# Patient Record
Sex: Female | Born: 1965 | Race: White | Marital: Married | Smoking: Never smoker
Health system: Northeastern US, Academic
[De-identification: ages and names within clinical notes are randomized; demographics above are authoritative.]

## PROBLEM LIST (undated history)

## (undated) DIAGNOSIS — I10 Essential (primary) hypertension: Secondary | ICD-10-CM

## (undated) DIAGNOSIS — E119 Type 2 diabetes mellitus without complications: Secondary | ICD-10-CM

## (undated) DIAGNOSIS — J45909 Unspecified asthma, uncomplicated: Secondary | ICD-10-CM

## (undated) DIAGNOSIS — K219 Gastro-esophageal reflux disease without esophagitis: Secondary | ICD-10-CM

## (undated) DIAGNOSIS — F419 Anxiety disorder, unspecified: Secondary | ICD-10-CM

## (undated) DIAGNOSIS — C50911 Malignant neoplasm of unspecified site of right female breast: Secondary | ICD-10-CM

## (undated) DIAGNOSIS — F32A Depression, unspecified: Secondary | ICD-10-CM

## (undated) HISTORY — PX: CHOLECYSTECTOMY: SHX55

## (undated) HISTORY — PX: BILATERAL MASTECTOMY: SHX3A

## (undated) HISTORY — PX: OTHER SURGICAL HISTORY: SHX169

## (undated) HISTORY — PX: APPENDECTOMY: SHX54

---

## 2007-03-31 ENCOUNTER — Encounter: Payer: Self-pay | Admitting: Cardiology

## 2007-07-11 ENCOUNTER — Ambulatory Visit (HOSPITAL_COMMUNITY): Admission: RE | Admit: 2007-07-11 | Discharge: 2007-07-11 | Payer: Self-pay | Admitting: Obstetrics and Gynecology

## 2009-01-29 IMAGING — MG MS DIGITAL SCREENING BILAT
4 series · 4 of 4 positions shown · non-contrast
Comparison: none

DG SCHOLARSHIP MAMMOGRAM
Bilateral CC and MLO view(s) were taken.

DIGITAL SCREENING MAMMOGRAM WITH CAD:
The breast tissue is heterogeneously dense.  There is no dominant mass, architectural distortion or
calcification to suggest malignancy.

[R CC]
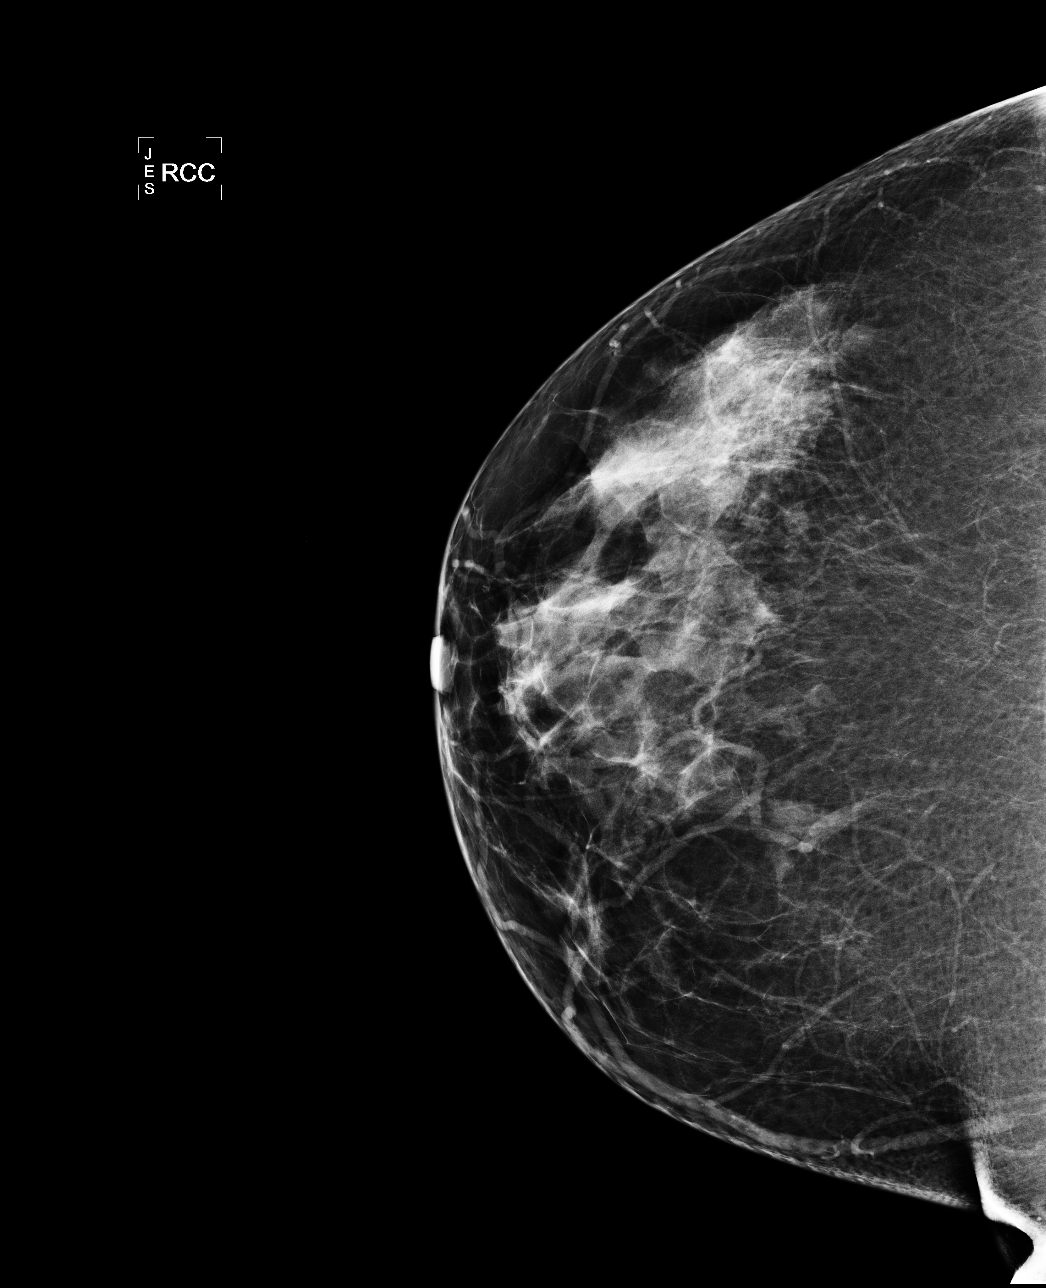

[R MLO]
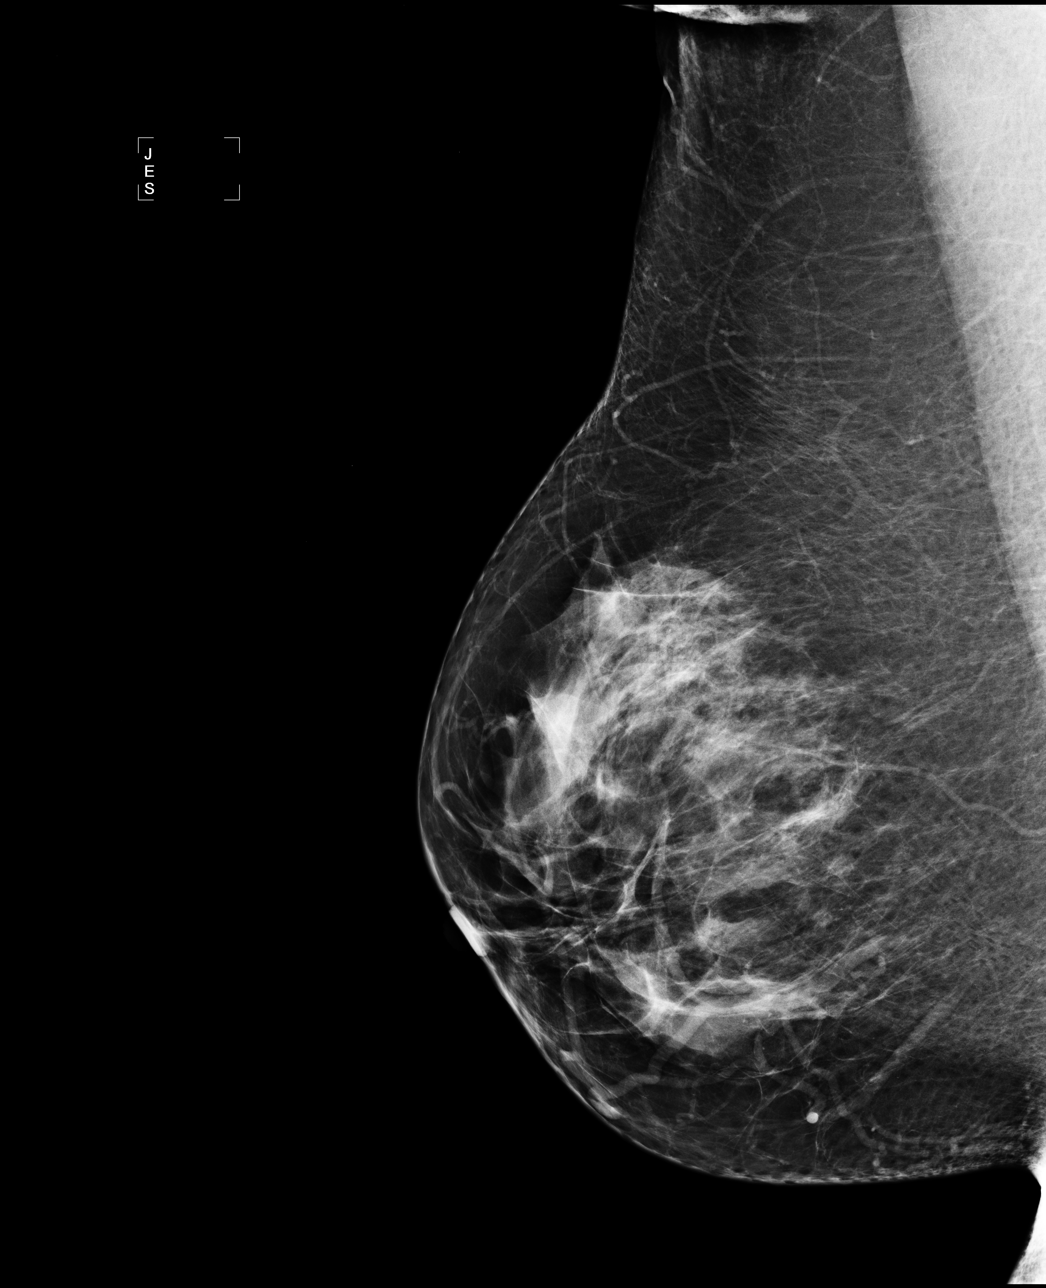

[L CC]
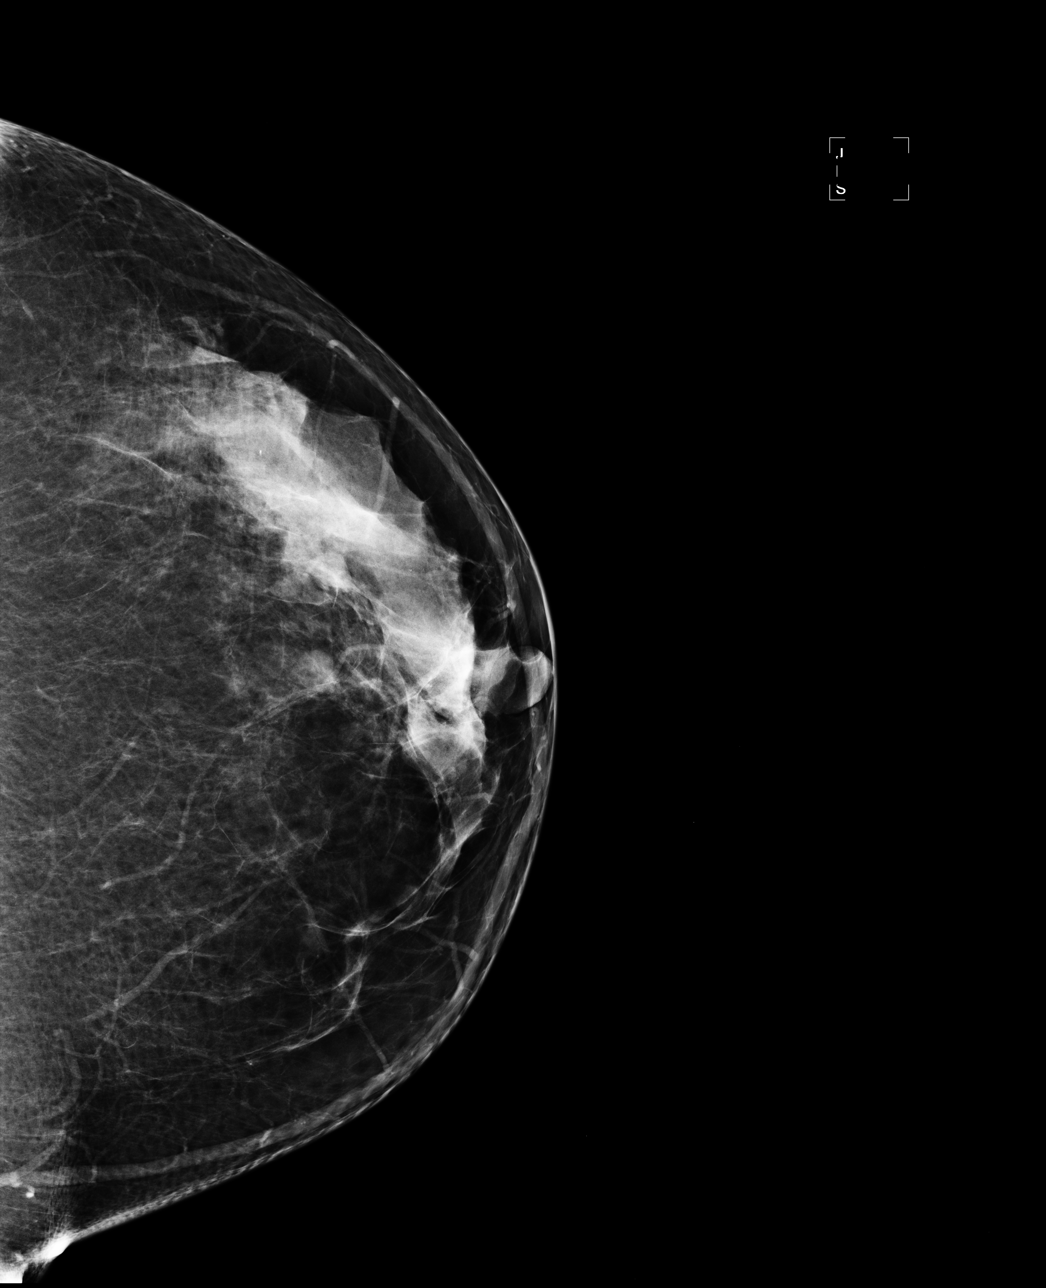

[L MLO]
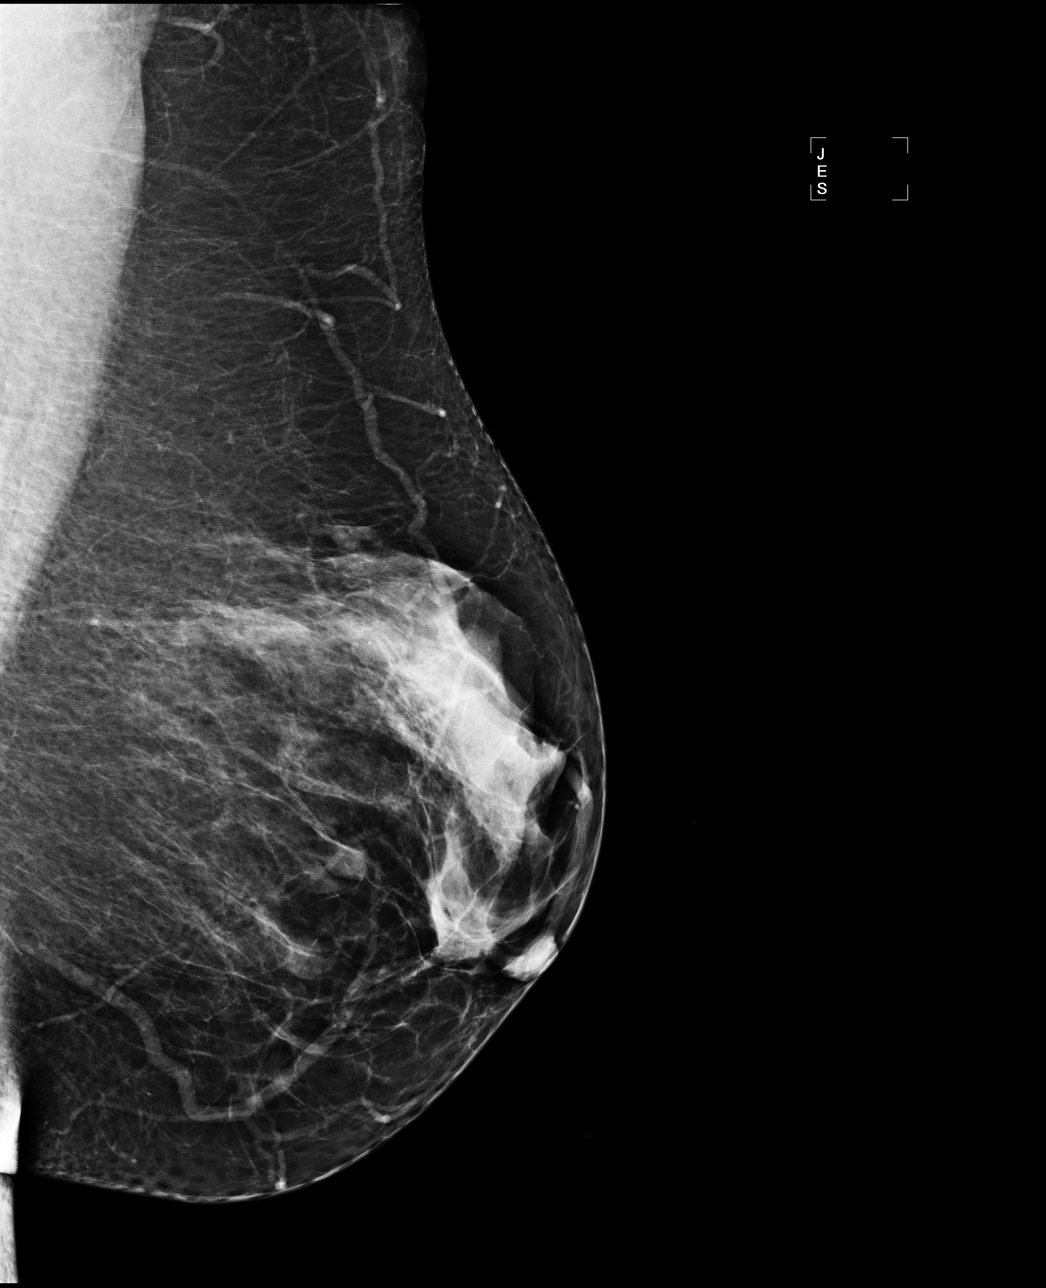

[4 of 4 positions shown; findings below may reference images not displayed]

IMPRESSION: No mammographic evidence of malignancy.  Suggest yearly screening mammography.

ASSESSMENT: Negative - BI-RADS 1

Screening mammogram in 1 year.
ANALYZED BY COMPUTER AIDED DETECTION. , THIS PROCEDURE WAS A DIGITAL MAMMOGRAM.

## 2010-12-03 ENCOUNTER — Encounter: Payer: Self-pay | Admitting: *Deleted

## 2010-12-07 NOTE — Telephone Encounter (Signed)
A user error has taken place: encounter opened in error, closed for administrative reasons.

## 2012-03-29 ENCOUNTER — Other Ambulatory Visit: Payer: Self-pay | Admitting: Family Medicine

## 2012-11-21 ENCOUNTER — Encounter: Payer: Self-pay | Admitting: Neurosurgery

## 2012-11-21 ENCOUNTER — Ambulatory Visit
Admit: 2012-11-21 | Discharge: 2012-11-21 | Disposition: A | Discharge status: Home / Self Care | Payer: Self-pay | Source: Ambulatory Visit | Attending: Neurosurgery | Admitting: Neurosurgery

## 2012-11-21 DIAGNOSIS — M51379 Other intervertebral disc degeneration, lumbosacral region without mention of lumbar back pain or lower extremity pain: Secondary | ICD-10-CM

## 2012-11-21 DIAGNOSIS — M5137 Other intervertebral disc degeneration, lumbosacral region: Secondary | ICD-10-CM | POA: Diagnosis present

## 2012-11-21 HISTORY — DX: Other intervertebral disc degeneration, lumbosacral region: M51.37

## 2012-11-21 HISTORY — DX: Depression, unspecified: F32.A

## 2012-11-21 HISTORY — DX: Unspecified asthma, uncomplicated: J45.909

## 2012-11-21 HISTORY — DX: Essential (primary) hypertension: I10

## 2012-11-21 HISTORY — DX: Gastro-esophageal reflux disease without esophagitis: K21.9

## 2012-11-21 HISTORY — DX: Other intervertebral disc degeneration, lumbosacral region without mention of lumbar back pain or lower extremity pain: M51.379

## 2012-11-21 HISTORY — DX: Anxiety disorder, unspecified: F41.9

## 2012-11-21 LAB — CBC
Hematocrit: 37 % (ref 34–45)
Hemoglobin: 12.5 g/dL (ref 11.2–15.7)
MCV: 86 fL (ref 79–95)
Platelets: 319 10*3/uL (ref 160–370)
RBC: 4.3 MIL/uL (ref 3.9–5.2)
RDW: 13 % (ref 11.7–14.4)
WBC: 7.9 10*3/uL (ref 4.0–10.0)

## 2012-11-21 LAB — PROTIME-INR
INR: 1.1 (ref 1.0–1.2)
Protime: 11.6 s (ref 9.2–12.3)

## 2012-11-21 LAB — TYPE AND SCREEN
ABO RH Blood Type: A POS
Antibody Screen: NEGATIVE

## 2012-11-21 LAB — APTT: aPTT: 31.2 s (ref 25.8–37.9)

## 2012-11-21 LAB — BASIC METABOLIC PANEL
Anion Gap: 13 (ref 7–16)
CO2: 24 mmol/L (ref 20–28)
Calcium: 9.3 mg/dL (ref 8.6–10.2)
Chloride: 101 mmol/L (ref 96–108)
Creatinine: 0.74 mg/dL (ref 0.51–0.95)
GFR,Black: 112 *
GFR,Caucasian: 97 *
Glucose: 110 mg/dL — ABNORMAL HIGH (ref 60–99)
Lab: 10 mg/dL (ref 6–20)
Potassium: 4 mmol/L (ref 3.3–5.1)
Sodium: 138 mmol/L (ref 133–145)

## 2012-11-21 NOTE — Discharge Instructions (Signed)
Pre-Operative Instructions Record                    [facility] [id number] MR    PRIOR TO SURGERY    Starting now, you must STOP all aspirin, ibuprofen (Advil, Naproxen, Aleve, Motrin, etc.) and all vitamins and herbal supplements. YOU MAY TAKE ACETAMINOPHEN (TYLENOL).    FOLLOW YOUR SURGEON'S INSTRUCTIONS IF DIFFERENT THAN ABOVE.    FRIDAY BEFORE SURGERY - 7/11    Call [phone] between 1PM and 4PM and select option #1 to receive your arrival/surgery time.            Do not eat anything (including candy or gum) after midnight the night before your surgery.  ____________________________________________________________________________    DAY OF SURGERY - 7/14    Up to 4 hours before your surgery time, clear liquids are allowed (unless your doctor tells you differently).  Examples: black coffee or tea (no dairy or non-dairy creamer), soda, water, clear apple or cranberry juice.  No orange or tomato juice.     MEDICATIONS   PLEASE REFER TO THE MEDICATION LIST ON THE ATTACHED PAGE AND ONLY TAKE THE MEDICATIONS MARKED ON THAT LIST.   DO NOT TAKE ANY MEDICATIONS THAT WERE ALREADY STOPPED (ABOVE).   Pain and anxiety medications may be taken with a sip of water at any time.    DO NOT WEAR ANY RINGS, JEWELRY, MAKEUP, DARK NAIL POLISH, HAIR PINS, BODY LOTION OR SCENTS.  You may brush your teeth and use deodorant.  If wearing eyeglasses, please bring a case.  DO NOT WEAR CONTACT LENSES.          _X_ Skin prep and instructions given      _X_ Bring back brace  ________________________________________________________________________________    AT THE HOSPITAL  Park in the Main Ramp garage.  Report to [facility] on [address].  Leave your belongings in the car and your visitors can bring them to your room after surgery.    Any questions? Call [phone], select option 1, and ask to speak with a nurse or call your  surgeon.    The patient has participated in the development of this discharge plan and the above material has been reviewed.  Questions have been answered and he/she understands the contents of this plan and has received a copy of it.  Randie Heinz, RN 11/21/2012 11:01 AM        ON THE DAY OF YOUR SURGERY, FROM THE LIST OF MEDICATIONS BELOW, TAKE ONLY THOSE MEDICATIONS CHECKED.

## 2012-11-21 NOTE — Preop H&P (Signed)
OUTPATIENT  Chief Complaint: Lumbar degeneration    History of Present Illness:  HPI  47 year old female presents with complaints of lumbar degeneration seeking a TLIF L5-S1 with lumbar laminectomies.  Patient denies any known or personal family history of complications related to anesthesia.      Past Medical History   Diagnosis Date   . Degeneration of lumbar or lumbosacral intervertebral disc 11/21/2012   . Anxiety    . Asthma    . Depression    . GERD (gastroesophageal reflux disease)    . Hypertension      Past Surgical History   Procedure Laterality Date   . Appendectomy       History reviewed. No pertinent family history.  History     Social History   . Marital Status: Married     Spouse Name: N/A     Number of Children: N/A   . Years of Education: N/A     Social History Main Topics   . Smoking status: Never Smoker    . Smokeless tobacco: Never Used   . Alcohol Use: Yes      Comment: once weekly   . Drug Use: No   . Sexually Active: None      Comment: iud     Other Topics Concern   . None     Social History Narrative   . None       Allergies:   Allergies   Allergen Reactions   . Penicillins Other (See Comments)     UNKNOWN       Current Outpatient Prescriptions   Medication   . lisinopril (PRINIVIL,ZESTRIL) 40 MG tablet   . atenolol (TENORMIN) 25 MG tablet   . omeprazole (PRILOSEC) 20 MG capsule   . PARoxetine (PAXIL-CR) 25 MG 24 hr tablet   . morphine (KADIAN) 30 MG 24 hr capsule   . Diphenhydramine-APAP, sleep, (TYLENOL PM EXTRA STRENGTH PO)   . cyclobenzaprine (FLEXERIL) 5 MG tablet     No current facility-administered medications for this encounter.        Review of Systems:   Review of Systems   Constitutional: Negative for fever, chills and diaphoresis.   HENT: Negative for ear pain, congestion and sore throat.    Eyes: Negative for blurred vision and pain.   Respiratory: Negative for cough, hemoptysis, shortness of breath and wheezing.         Hx of asthma, resolved after receiving allergy shots    Cardiovascular: Negative for chest pain, palpitations and leg swelling.        Hx of HTN, managed by PCP.  Patient denies cardiac testing.  States able to climb a flight of stairs without experiencing sob.     Gastrointestinal: Negative for heartburn, nausea, vomiting, abdominal pain and constipation.   Genitourinary: Negative for dysuria and urgency.   Musculoskeletal: Positive for back pain.   Skin: Negative for rash.   Neurological: Negative for dizziness and headaches.   Endo/Heme/Allergies: Does not bruise/bleed easily.   Psychiatric/Behavioral: Negative for depression.       Last Nursing documented pain:        Patient Vitals for the past 24 hrs:   BP Pulse Height Weight   11/21/12 1057 147/93 mmHg 88 1.676 m (5' 5.98") 106.595 kg (235 lb)   11/21/12 1052 147/93 mmHg 88 1.676 m (5\' 6" ) 106.595 kg (235 lb)     O2 Device: None (Room air) (11/21/12 1112)      Physical Exam  Vitals reviewed.  Constitutional: She is oriented to person, place, and time. She appears well-developed and well-nourished. No distress.   HENT:   Head: Normocephalic.   Nose: Nose normal.   Mouth/Throat: Oropharynx is clear and moist. No oropharyngeal exudate.   Mouth opens fully   Eyes: EOM are normal. Pupils are equal, round, and reactive to light. Right eye exhibits no discharge. Left eye exhibits no discharge. No scleral icterus.   Neck: Normal range of motion and full passive range of motion without pain. Neck supple. Carotid bruit is not present.   Cardiovascular: Normal rate, regular rhythm, normal heart sounds and intact distal pulses.  Exam reveals no gallop and no friction rub.    No murmur heard.  Pulmonary/Chest: Effort normal and breath sounds normal. No respiratory distress. She has no wheezes. She has no rales.   Musculoskeletal: Normal range of motion. She exhibits no edema and no tenderness.   Lymphadenopathy:     She has no cervical adenopathy.   Neurological: She is alert and oriented to person, place, and time. No  cranial nerve deficit.   Skin: Skin is warm and dry. No rash noted. She is not diaphoretic. No erythema. No pallor.   Surgical site intact without rashes, wounds, abrasions or open areas noted at time of exam.   Psychiatric: She has a normal mood and affect.       Lab Results: none    Radiology impressions (last 3 days):  No results found.    Currently Active/Followed Hospital Problems:  Active Hospital Problems    Diagnosis   . Degeneration of lumbar or lumbosacral intervertebral disc       Assessment: Lumbar degeneration    Plan: TLIF L5-S1 with lumbar laminectomies    Author: Renelda Loma, NP  Note created: 11/21/2012  at: 12:39 PM

## 2012-11-26 ENCOUNTER — Inpatient Hospital Stay
Admit: 2012-11-26 | Disposition: A | Discharge status: Home / Self Care | Payer: Self-pay | Source: Ambulatory Visit | Attending: Neurosurgery | Admitting: Neurosurgery

## 2012-11-26 ENCOUNTER — Other Ambulatory Visit: Payer: Self-pay | Admitting: Neurosurgery

## 2012-11-26 ENCOUNTER — Encounter: Payer: Self-pay | Admitting: Neurosurgery

## 2012-11-26 DIAGNOSIS — F32A Depression, unspecified: Secondary | ICD-10-CM | POA: Diagnosis present

## 2012-11-26 DIAGNOSIS — F419 Anxiety disorder, unspecified: Secondary | ICD-10-CM | POA: Diagnosis present

## 2012-11-26 DIAGNOSIS — I10 Essential (primary) hypertension: Secondary | ICD-10-CM | POA: Diagnosis present

## 2012-11-26 DIAGNOSIS — K219 Gastro-esophageal reflux disease without esophagitis: Secondary | ICD-10-CM | POA: Diagnosis present

## 2012-11-26 DIAGNOSIS — M5137 Other intervertebral disc degeneration, lumbosacral region: Secondary | ICD-10-CM

## 2012-11-26 LAB — POCT GLUCOSE
Glucose POCT: 98 mg/dL (ref 60–99)
Glucose POCT: 143 mg/dL — ABNORMAL HIGH (ref 60–99)

## 2012-11-26 LAB — POCT URINE PREGNANCY: Lot #: 104663

## 2012-11-26 MED ORDER — BISACODYL 10 MG RE SUPP *I*
10.0000 mg | Freq: Every day | RECTAL | Status: DC | PRN
Start: 2012-11-26 — End: 2012-11-29
  Filled 2012-11-26: qty 1

## 2012-11-26 MED ORDER — DOCUSATE SODIUM 100 MG PO CAPS *I*
100.0000 mg | ORAL_CAPSULE | Freq: Two times a day (BID) | ORAL | Status: DC
Start: 2012-11-26 — End: 2012-11-29
  Administered 2012-11-27: 100 mg via ORAL
  Filled 2012-11-26 (×4): qty 1

## 2012-11-26 MED ORDER — ATENOLOL 25 MG PO TABS *I*
25.0000 mg | ORAL_TABLET | Freq: Every day | ORAL | Status: DC
Start: 2012-11-26 — End: 2012-11-29
  Filled 2012-11-26 (×3): qty 1

## 2012-11-26 MED ORDER — LACTATED RINGERS IV SOLN *I*
125.0000 mL/h | INTRAVENOUS | Status: DC
Start: 2012-11-26 — End: 2012-11-26
  Administered 2012-11-26: 125 mL/h via INTRAVENOUS

## 2012-11-26 MED ORDER — HYDROMORPHONE HCL PF 1 MG/ML IJ SOLN *WRAPPED*
0.4000 mg | INTRAMUSCULAR | Status: DC | PRN
Start: 2012-11-26 — End: 2012-11-26
  Administered 2012-11-26 (×3): 0.4 mg via INTRAVENOUS
  Filled 2012-11-26 (×2): qty 1

## 2012-11-26 MED ORDER — CLINDAMYCIN PHOSPHATE IN D5W 900 MG/50ML IV SOLN *I*
900.0000 mg | Freq: Three times a day (TID) | INTRAVENOUS | Status: AC
Start: 2012-11-26 — End: 2012-11-27
  Administered 2012-11-26 – 2012-11-27 (×3): 900 mg via INTRAVENOUS
  Filled 2012-11-26 (×3): qty 50

## 2012-11-26 MED ORDER — ONDANSETRON HCL 2 MG/ML IV SOLN *I*
4.0000 mg | Freq: Once | INTRAMUSCULAR | Status: DC | PRN
Start: 2012-11-26 — End: 2012-11-26

## 2012-11-26 MED ORDER — BUPIVACAINE HCL 0.25 % IJ SOLN
INTRAMUSCULAR | Status: AC
Start: 2012-11-26 — End: 2012-11-26
  Filled 2012-11-26: qty 30

## 2012-11-26 MED ORDER — BUPIVACAINE-EPINEPHRINE 0.5 % IJ SOLN
INTRAMUSCULAR | Status: AC
Start: 2012-11-26 — End: 2012-11-26
  Filled 2012-11-26: qty 30

## 2012-11-26 MED ORDER — MAGNESIUM HYDROXIDE 400 MG/5ML PO SUSP *I*
30.0000 mL | Freq: Every day | ORAL | Status: DC | PRN
Start: 2012-11-26 — End: 2012-11-29
  Filled 2012-11-26: qty 30

## 2012-11-26 MED ORDER — BACITRACIN 50000 UNIT IM SOLR *I*
INTRAMUSCULAR | Status: AC
Start: 2012-11-26 — End: 2012-11-26
  Filled 2012-11-26: qty 50000

## 2012-11-26 MED ORDER — CLINDAMYCIN PHOSPHATE IN D5W 900 MG/50ML IV SOLN *I*
900.0000 mg | INTRAVENOUS | Status: DC
Start: 2012-11-26 — End: 2012-11-26
  Filled 2012-11-26: qty 50

## 2012-11-26 MED ORDER — LISINOPRIL 20 MG PO TABS *I*
40.0000 mg | ORAL_TABLET | Freq: Every day | ORAL | Status: DC
Start: 2012-11-26 — End: 2012-11-29
  Administered 2012-11-26: 40 mg via ORAL
  Filled 2012-11-26 (×4): qty 2

## 2012-11-26 MED ORDER — HEPARIN SODIUM 5000 UNIT/ML SQ *I*
5000.0000 [IU] | Freq: Once | SUBCUTANEOUS | Status: AC
Start: 2012-11-26 — End: 2012-11-26
  Administered 2012-11-26: 5000 [IU] via SUBCUTANEOUS
  Filled 2012-11-26: qty 1

## 2012-11-26 MED ORDER — LACTATED RINGERS IV SOLN *I*
125.0000 mL/h | INTRAVENOUS | Status: DC
Start: 2012-11-26 — End: 2012-11-27
  Administered 2012-11-26: 125 mL/h via INTRAVENOUS

## 2012-11-26 MED ORDER — PANTOPRAZOLE SODIUM 40 MG PO TBEC *I*
40.0000 mg | DELAYED_RELEASE_TABLET | Freq: Every morning | ORAL | Status: DC
Start: 2012-11-27 — End: 2012-11-29
  Administered 2012-11-27 – 2012-11-29 (×3): 40 mg via ORAL
  Filled 2012-11-26 (×4): qty 1

## 2012-11-26 MED ORDER — LIDOCAINE HCL 1 % IJ SOLN
0.1000 mL | INTRAMUSCULAR | Status: DC | PRN
Start: 2012-11-26 — End: 2012-11-26
  Administered 2012-11-26: 0.1 mL via SUBCUTANEOUS

## 2012-11-26 MED ORDER — SODIUM CHLORIDE 0.9 % IV SOLN WRAPPED *I*
20.0000 mL/h | Status: DC
Start: 2012-11-26 — End: 2012-11-26

## 2012-11-26 MED ORDER — SENNOSIDES 8.6 MG PO TABS *I*
2.0000 | ORAL_TABLET | Freq: Every evening | ORAL | Status: DC
Start: 2012-11-26 — End: 2012-11-29
  Administered 2012-11-27: 2 via ORAL
  Filled 2012-11-26 (×3): qty 2

## 2012-11-26 MED ORDER — HEPARIN SODIUM 5000 UNIT/ML SQ *I*
5000.0000 [IU] | Freq: Three times a day (TID) | SUBCUTANEOUS | Status: DC
Start: 2012-11-27 — End: 2012-11-29
  Administered 2012-11-27 – 2012-11-29 (×8): 5000 [IU] via SUBCUTANEOUS
  Filled 2012-11-26 (×8): qty 1

## 2012-11-26 MED ORDER — RETIRED-MORPHINE SULFATE 5 MG/ML PCA INJ SOLN *I*
INTRAMUSCULAR | Status: DC
Start: 2012-11-26 — End: 2012-11-27
  Filled 2012-11-26: qty 25

## 2012-11-26 MED ORDER — NALOXONE HCL 0.4 MG/ML IJ SOLN *WRAPPED*
0.4000 mg | Status: DC | PRN
Start: 2012-11-26 — End: 2012-11-29

## 2012-11-26 MED ORDER — LACTATED RINGERS IV SOLN *I*
20.0000 mL/h | INTRAVENOUS | Status: DC
Start: 2012-11-26 — End: 2012-11-26
  Administered 2012-11-26: 20 mL/h via INTRAVENOUS

## 2012-11-26 MED ORDER — CYCLOBENZAPRINE HCL 10 MG PO TABS *I*
10.0000 mg | ORAL_TABLET | Freq: Three times a day (TID) | ORAL | Status: DC | PRN
Start: 2012-11-26 — End: 2012-11-29
  Administered 2012-11-26 – 2012-11-29 (×8): 10 mg via ORAL
  Filled 2012-11-26 (×9): qty 1

## 2012-11-26 MED ORDER — THROMBIN (RECOMBINANT) 5000 UNIT EX SOLR *I* WRAPPED
CUTANEOUS | Status: AC
Start: 2012-11-26 — End: 2012-11-26
  Filled 2012-11-26: qty 2

## 2012-11-26 MED ORDER — PROMETHAZINE HCL 25 MG/ML IJ SOLN *I*
6.2500 mg | Freq: Once | INTRAMUSCULAR | Status: AC | PRN
Start: 2012-11-26 — End: 2012-11-26
  Administered 2012-11-26: 6.25 mg via INTRAVENOUS
  Filled 2012-11-26: qty 1

## 2012-11-26 MED ORDER — ONDANSETRON HCL 2 MG/ML IV SOLN *I*
4.0000 mg | Freq: Four times a day (QID) | INTRAMUSCULAR | Status: DC | PRN
Start: 2012-11-26 — End: 2012-11-29

## 2012-11-26 MED ORDER — PAROXETINE HCL 12.5 MG PO TB24 *I*
25.0000 mg | ORAL_TABLET | Freq: Every morning | ORAL | Status: DC
Start: 2012-11-27 — End: 2012-11-29
  Administered 2012-11-27 – 2012-11-28 (×2): 25 mg via ORAL
  Filled 2012-11-26 (×3): qty 2

## 2012-11-26 MED ORDER — POVIDONE-IODINE 5 % EX SOLN *I*
Freq: Once | CUTANEOUS | Status: AC
Start: 2012-11-26 — End: 2012-11-26

## 2012-11-26 NOTE — Progress Notes (Addendum)
Pt instructed on use of pca and understands.she has used it in [employer] with good pain controll.denies nausea. Meets all pacu d/c criteria and transferred to floor

## 2012-11-26 NOTE — Discharge Instructions (Signed)
[facility]  Dr. Seth Zeidman  [phone]    Discharge Instructions-Lumbar Fusion    Medication:You will be prescribed pain medication. This this on an as-needed basis. Prescriptions for pain medication will be provided for you for up to 8 weeks after surgery. During that time if you will need a refill, please call the office at least 4 days before you run out so it can be sent in the mail. We cannot phone in narcotic prescriptions. If you require additional medication after 8 weeks, this will need to be addressed with your primary care provider.    Do not use aspirin, NSAIDS (non-steroidal anti-inflammatory medications) such as Advil, Aleve, ibuprofen, Motrin, Celebrex or any other blood thinning medication without approval from our office within the first 6 weeks after surgery. These medications may be resumed 6 weeks after the date of your surgery.     Activity/Restrictions: Wear the brace at all times when out of bed for 6 weeks (except in shower), even when seated in a chair. Please call your brace representative for any problems with the fitting of the brace. Wear a thin cotton shirt between your skin and the brace to prevent rubbing on the surgical site.    You should walk daily, gradually increasing the time and the distance as tolerated. Do not stay in any position for longer than 2 hours during the day, whether this is lying or sitting. It will contribute to your back stiffness and pain.    No lifting greater than 10 pounds. You are to avoid strenuous activity. You may bend enough for dressing, going to the bathroom, and getting up and down from bed or chair only. Do not bend over to the floor.    You may walk up a few steps to get in and out of your home only for the first 2 weeks. Flights of stairs are permitted after 2 weeks. (All arrangements to accommodate your needs for home will be made during your hospital stay)    You may begin to drive 6 weeks after your surgery only  if you are no longer taking narcotic pain medication.    You may resume gentle sexual activity after 6 weeks as tolerated.    Wound Care: You may shower when you get home, then remove dressing (if one is still in place) and leave incision open to air. You may need a small gauze dressing if you have any drainage from the wound. Keep the wound as dry as possible, covering it when you shower (we recommend covering your incision with Saran wrap and tape) for the first week then shower uncovered. Try to avoid the water hitting directly on the incision. You may leave steri-strips (small pieces of tape) over the wound. You should remove the steri-strips after 7-10 days if they have not already fallen off on their own. Do not use creams or lotions on the incision. No tub baths, swimming or hot tubs until the incision is well sealed (approx. 8 weeks).    General: You should drink adequate fluids and use a stool softener and/or laxative or suppository to ensure regular bowel movements after surgery. These are available over the counter at your pharmacy. Do not go more than 4 days without utilizing one of these medications to avoid significant constipation which will contribute to your pain.    Physical therapy: May be ordered for you at some point after surgery to be determined on an individual basis.      Please call if you develop prolonged fevers, chills, difficultly with urination or bowel movements (despite trying laxatives, suppositories and enemas), persistent nausea/vomiting, severe pain not relieved with pain medication (some pain is to be expected), or any significant or foul drainage from the wound. And remember, NO SMOKING!!! It inhibits bone fusion and healing. Don't undo all of your hard work at recovery!

## 2012-11-26 NOTE — INTERIM OP NOTE (Signed)
Interim Op Note    Date of Surgery: 11/26/2012  Surgeon: Lysbeth Galas, MD  First Assistant: Karle Barr, PA    Pre-Op Diagnosis: lumbar disc disease    Anesthesia Type: General, Monitored Anesthesia Care    Post-Op Diagnosis    Primary: same  Secondary:       Procedure(s) Performed    TLIF L5-S1 with lumbar laminectomies    Estimated Blood Loss: 200 cc   Packing: No  Drains: Yes, Type JP, # 1. Removed? NO, plan for removal is post op  Fluid Totals: Intakes: 2500 cc Outputs: 175 cc foley  Specimens to Pathology: yes  Patient Condition: guarded    Karle Barr, PA

## 2012-11-26 NOTE — Op Note (Signed)
OPERATIVE NOTE    SURGEON:  Lysbeth Galas, MD  ASSISTANT Nate Brochu, RPA-C                           Karle Barr, RPA-C  SURGERY DATE: 11/26/2012  PREOPERATIVE DIAGNOSIS: 722.52  Lumbar Disc Degeneration    POSTOPERATIVE DIAGNOSIS:  722.52    Lumbar Disc Degeneration    OPERATIVE PROCEDURE:    CIRCUMFERENTIAL 360 DEGREE FUSION THROUGH SINGLE INCISION       1.  Laminectomies L4, L5, S1  2.  Instrumented posterior fusion L5-S1  3.  Arthrodesis L5-S1 Posterolateral  4.  Arthrodesis L5-S1, Interbody  5.  Prosthetic Interbody Device        6.  Autologous bone graft harvest, same fascial incision       7.  Bone Marrow Aspirate - L5, S1       8.  Intraoperative Neuromonitoring             ANESTHESIA: General endotracheal anesthesia.     PERTINENT HISTORY / INDICATION FOR OPERATION:    Patient with pain in the buttocks, legs, and hips.  Worsening sciatica.  Diagnostic imaging demonstrates disc degeneration at L5-S1 with disc herniation, osteophytic spurs in both foramina.   The patient is now admitted for lumbar laminectomies with fusion.  Risks associated with the surgery were discussed with the patient in detail including Bleeding, Infection, Discomfort, Stroke, Paralysis, Death, CSF Leak, Dural Tear, Vascular Injury, Loss of Bladder and Bowel Function, Failure of Fusion and Nerve Injury.  The patient understands and accepts the risks associated with the surgery and is anxious to proceed with surgery.    DESCRIPTION OF PROCEDURE:  The patient was brought to the operating room. The patient was intubated in the supine position, turned into the prone position on the [place] table. All extremities were appropriately padded. The patient was then shaved, prepped and draped in sterile fashion.   1.     Lumbar Laminectomies L4, L5, S1   Incision was made in the midline from L4 down to S1. Incision was carried down to the lumbodorsal fascia. Paraspinal musculature was dissected off in subperiosteal fashion out over the facet  joints of L5-S1 sparing the L4-5 facet capsules. Following this, the transverse processes were exposed of L5 and S1. Laminectomies were performed of the superior portion of S1, L5 and the inferior portion of L4.  The required laminectomies necessitated the performance of additional work above and beyond that described by the posterior lumbar interbody fusion, including facetectomies and foraminotomies, to adequately decompress the nerve roots.    2.     Instrumented posterior fusion L5-S1: Pedicle screws were placed into L5 & S1 with 6.5 mm Legacy screws at L5 and S1.  Four screws were placed in total under fluoroscopic guidance and the radiographic position was confirmed in both the AP and lateral projections using the C-arm.  Nerve roots were free and clear at all levels and the screws were in excellent position.    3. Arthrodesis L5-S1 - Posterolateral   Following packing of the decorticated transverse processes and lateral aspect of the facet joint with the morsellized bone, rods were placed at each level and locked into position using the locking screws.  4.  Arthrodesis L5-S1 Interbody  Complete discectomy was performed at L5-S1.  The anterior portion of the disc was packed with autologous bone graft.  Following this the Valeo Spacers were placed under direct vision.  5.     Prosthetic Interbody Device   Valeo Spacer packed with autologous bone graft, Mo[place] bone graft extender, and Infuse BMP was tamped into position following preparation of the endplates and disc space.   6. Autologous bone graft harvested through same fascial incision: Bone graft was harvested from the Lamina and Facet Joints.  Following this, the harvested bone was morsellized.          7.    Bone Marrow Aspirate, L5, S1  Bone Marrow was aspirated from the vertebral bodies through the pedicle screw holes.    This was morsellized through the bone mill, mixed with local autograft and bone graft extender and packed over the decorticated  transverse processes.    Following this, the wound was copiously irrigated. The lumbodorsal fascia was closed with interrupted 0 Vicryl sutures, the subcutaneous tissue with 2-0 Vicryl sutures, skin with 2-0 Vicryl sutures, and a Vicryl. Steri-Strips were applied to the skin.          8.   Intraoperative Neuromonitoring    Leads were applied to the legs and tested for accuracy.  Intraoperative Monitoring was performed with intermittent                                           Checking for neural structures at each stage of the procedure.  No nerves or nerve roots were identified to be at risk  within the operative field during the dissection, discectomy, or instrumentation phases of the procedure      OPERATIVE FINDINGS:  Severely loose joint at L5-S1 with significant disc herniation at L5-S1, Right.  Foraminal encroachment bilaterally right greater than left.     Bilateral and severe foraminal stenosis at L5-S1.  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    Signed:  Lysbeth Galas, MD   on 11/26/2012 at 11:04 PM

## 2012-11-26 NOTE — Progress Notes (Signed)
UPDATES TO PATIENT'S CONDITION on the DAY OF SURGERY/PROCEDURE    I. Updates to Patient's Condition (to be completed by a provider privileged to complete a H&P, following reassessment of the patient by the provider):    Day of Surgery/Procedure Update:  History  History reviewed and no change    Physical  Physical exam updated and no change              II. Procedure Readiness   I have reviewed the patient's H&P and updated condition. By completing and signing this form, I attest that this patient is ready for surgery/procedure.      III. Attestation   I have reviewed the updated information regarding the patient's condition and it is appropriate to proceed with the planned surgery/procedure.    Lysbeth Galas, MD as of 2:05 PM 11/26/2012

## 2012-11-26 NOTE — Progress Notes (Signed)
[facility] Progress Note  ________________________________________       Subjective:  Patient has no complaints aside from her feet feeling "different".     ROS:  A comprehensive review of systems was performed as possible and was negative except as stated above.       Physical Exam:  BP 110/67  Pulse 103  Temp(Src) 37.5 C (99.5 F) (Temporal)  Resp 14  Ht 1.676 m (5\' 6" )  Wt 106.595 kg (235 lb)  BMI 37.95 kg/m2  SpO2 94%    NAD  RRR  Breathing unlabored.  Surgical site is benign. JP is S-S.  Motor/Sensation intact to feet bilaterally.  Foley draining clear yellow urine.     Impression: 47 y.o. female POD# 0 s/p TLIF L5-S1 with laminectomies.    Plan:   Pain control   Continue JP   Regular diet   CMS checks   Routine post-op care   Care per neurosurgery.     Signed:  Narda Bonds, MD on 11/26/2012 as of 11:43 PM  [facility] -  Pager [phone]

## 2012-11-26 NOTE — Anesthesia Pre-procedure Eval (Signed)
Anesthesia Pre-operative Evaluation for [name]      Health History  Past Medical History   Diagnosis Date   . Degeneration of lumbar or lumbosacral intervertebral disc 11/21/2012   . Anxiety    . Asthma    . Depression    . GERD (gastroesophageal reflux disease)    . Hypertension      Past Surgical History   Procedure Laterality Date   . Appendectomy       Social History  History   Substance Use Topics   . Smoking status: Never Smoker    . Smokeless tobacco: Never Used   . Alcohol Use: Yes      Comment: once weekly      History   Drug Use No     ______________________________________________________________________  Allergies:   Allergies   Allergen Reactions   . Penicillins Other (See Comments)     UNKNOWN     Prior to Admission Medications    Last Medication Reconciliation Action:  Up-To-Date Reggy Eye, RN 11/26/2012 12:25 PM              Last Dose Start Date End Date Provider     Diphenhydramine-APAP, sleep, (TYLENOL PM EXTRA STRENGTH PO) 11/25/2012 at 2200  --  --  [provider]     PARoxetine (PAXIL-CR) 25 MG 24 hr tablet 11/25/2012 at 2200  --  --  [provider]     atenolol (TENORMIN) 25 MG tablet 11/26/2012 at 0930  --  --  [provider]     cyclobenzaprine (FLEXERIL) 5 MG tablet 11/26/2012 at 1000  --  --  [provider]     lisinopril (PRINIVIL,ZESTRIL) 40 MG tablet 11/25/2012 at 0800  --  --  [provider]     morphine (KADIAN) 30 MG 24 hr capsule 11/25/2012 at 0800  --  --  [provider]     omeprazole (PRILOSEC) 20 MG capsule 11/26/2012 at 0930  --  --  [provider]        Current Facility-Administered Medications   Medication   . Lactated Ringers Infusion   . lidocaine 1 % injection 0.1 mL   . sodium chloride 0.9 % IV   . clindamycin (CLEOCIN) IVPB 900 mg   . Lactated Ringers Infusion   . HYDROmorphone PF (DILAUDID) injection 0.4 mg   . ondansetron (ZOFRAN) injection 4 mg   . promethazine (PHENERGAN) injection 6.25 mg      Admission Medications:  Scheduled Meds  . clindamycin (CLEOCIN) IV     IV Meds  . lactated ringers 20 mL/hr (11/26/12 1244)   . sodium chloride     . lactated ringers     PRN Meds  . lidocaine 0.1 mL at 11/26/12 1254   . HYDROmorphone PF     . ondansetron     . promethazine       Anesthesia Evaluation Information Source: per patient, per family, per records  GENERAL    + Obesity  Pertinent (-):  history of anesthetic complications, FamHx of anesthetic complications    HEENT  Denies HEENT issues    PULMONARY    + Asthma (Childhood asthma. No issues in 20 years)  Pertinent (-):  smoking, recent URI CARDIOVASCULAR  Good(4+METs) Exercise Tolerance    + Hypertension            well controlled  Pertinent (-):  valvular disease, dysrhythmias    GI/HEPATIC/RENAL  Last PO Intake: >8hr  before procedure      + GERD            well controlled NEURO/PSYCH    + Psychiatric Issues          anxiety, depression    ENDO/OTHER    Denies endo issues  Pertinent (-):  diabetes mellitus, thyroid disease    HEMATOLOGIC    + Arthritis            back     Nursing Reported PO Status: Date Last PO Fluids: 11/26/12 1000 (coffee black)  Date Last PO Solids: 11/25/12 2330  ______________________________________________________________________  Physical Exam    Airway            Mouth opening: normal            Mallampati: II            TM distance (fb): >3 FB            Neck ROM: full  Dental   Normal Exam   Cardiovascular  Normal Exam           Rhythm: regular           Rate: normal    General Survey    Normal Exam   Pulmonary   pulmonary exam normal    breath sounds clear to auscultation    Mental Status     + Anxious         Most Recent Vitals: BP: 147/84 mmHg (11/26/12 1308)  Heart Rate: 88 (11/26/12 1308)  Temp: 36 C (96.8 F) (11/26/12 1159)  Resp: 18 (11/26/12 1159)  Height: 167.6 cm (5\' 6" ) (11/26/12 1159)  Weight: 106.595 kg (235 lb) (11/26/12 1159)  BMI (Calculated): 38 (11/26/12 1159)  BSA (Calculated - sq m): 2.23 sq meters  (11/26/12 1159)  SpO2: 98 % (11/26/12 1159)    Vital Sign Ranges (last 24hrs)  Temp:  [36 C (96.8 F)] 36 C (96.8 F)  Heart Rate:  [88-94] 88  Resp:  [18] 18  BP: (147-180)/(79-84) 147/84 mmHg        Most Recent Lab Results   Blood Type  Lab Results   Component Value Date    ABORH A RH POS 11/21/2012    ABS Negative 11/21/2012   CBC  Lab Results   Component Value Date    WBC 7.9 11/21/2012    HCT 37 11/21/2012    PLT 319 11/21/2012   Chem-7  Lab Results   Component Value Date    NA 138 11/21/2012    K 4.0 11/21/2012    CL 101 11/21/2012    CO2 24 11/21/2012    UN 10 11/21/2012    CREAT 0.74 11/21/2012    GLU 110* 11/21/2012    PGLU 98 11/26/2012   Estimated Creatinine Clearance: 87.98 ml/min (based on Cr of 0.74).  Electrolytes  Lab Results   Component Value Date    CA 9.3 11/21/2012   Coags  Lab Results   Component Value Date    PTI 11.6 11/21/2012    INR 1.1 11/21/2012    PTT 31.2 11/21/2012   LFTs  No results found for this basename: AST, ALT, ALK      Pregnancy Status: Implant [7]  No LMP recorded. Patient has had an implant.    Lab Results   Component Value Date    PUPT Negative-Dilute urine specimens may cause false negative urine pregnancy results... 11/26/2012   ANES CPM  ________________________________________________________________________  Medical Problems  Patient  Active Problem List    Diagnosis Date Noted   . Degeneration of lumbar or lumbosacral intervertebral disc 11/21/2012       PreOp/PreProcedure Diagnosis (For more detail see procedural consent)            Lumbar disc degeneration  Planned Procedure (For more detail see procedural consent)            TLIF L5-S1 with lumbar laminectomies L4-S1  Plan   ASA Score 2  Anesthetic Plan (general); Induction (routine IV); Airway (cuffed ETT); Line ( use current access); Monitoring (standard ASA); Positioning (prone and arms out); Pain (per surgical team); PostOp (PACU)    Informed Consent     Risks:          Risks discussed were commensurate with the plan listed above with the  following specific points:  N/V, sore throat , damage to:(eyes, teeth, nerves), allergic Rx, unexpected serious injury, death, awareness    Anesthetic Consent:         Anesthetic plan and risks discussed with:  patient, spouse and adult children    Blood products Consent:        Use of blood products discussed with:and they  Consented to blood products    Plan discussed with:  surgeon      Attending Attestation: The patient or proxy understand and accept the risks and benefits of the anesthesia plan. By accepting this note, I attest that I have personally performed the history and physical exam and prescribed the anesthetic plan within 48 hours prior to the anesthetic as documented by me above.    Author: Adelfa Koh, DO

## 2012-11-26 NOTE — Anesthesia Post-procedure Eval (Signed)
Anesthesia Post-op Note    Patient: [name]    Procedure(s) Performed: TLIF L5-S1 with lumbar laminectomies    Anesthesia type: General    Patient location: PACU    Mental Status: Recovered to baseline    Patient able to participate in this evaluation: yes  Last Vitals: BP: 113/67 mmHg (11/26/12 1715)  BP MAP : 77 mmHg (11/26/12 1715)  Heart Rate: 88 (11/26/12 1715)  Temp: 36.5 C (97.7 F) (11/26/12 1709)  Resp: 13 (11/26/12 1715)  Height: 167.6 cm (5\' 6" ) (11/26/12 1159)  Weight: 106.595 kg (235 lb) (11/26/12 1159)  BMI (Calculated): 38 (11/26/12 1159)  BSA (Calculated - sq m): 2.23 sq meters (11/26/12 1159)  SpO2: 98 % (11/26/12 1715)      Post-op vital signs noted above are within patient's normal range  Post-op vitals signs: stable  Respiratory function: baseline    Airway patent: Yes    Cardiovascular and hydration status stable: Yes    Post-Op pain: Adequate analgesia    Post-Op nausea and vomiting: none    Post-Op assessment: no apparent anesthetic complications, tolerated procedure well and no evidence of recall    Complications: none    Attending Attestation: All indicated post anesthesia care provided    Author: Adelfa Koh, DO  as of: 11/26/2012  at: 5:56 PM

## 2012-11-27 DIAGNOSIS — M519 Unspecified thoracic, thoracolumbar and lumbosacral intervertebral disc disorder: Secondary | ICD-10-CM | POA: Diagnosis present

## 2012-11-27 MED ORDER — HYDROCODONE-ACETAMINOPHEN 5-325 MG PO TABS *I*
2.0000 | ORAL_TABLET | ORAL | Status: DC | PRN
Start: 2012-11-27 — End: 2012-11-29
  Administered 2012-11-27 – 2012-11-29 (×12): 2 via ORAL
  Filled 2012-11-27 (×13): qty 2

## 2012-11-27 MED ORDER — NONFORMULARY (OTHER) ORDER *I*
40.0000 mg | Freq: Once | Status: DC
Start: 2012-11-27 — End: 2012-11-27
  Administered 2012-11-27: 40 mg via ORAL

## 2012-11-27 MED ORDER — MORPHINE SULFATE 2 MG/ML IV SOLN *WRAPPED*
2.0000 mg | Status: DC | PRN
Start: 2012-11-27 — End: 2012-11-29
  Administered 2012-11-27: 2 mg via INTRAVENOUS
  Filled 2012-11-27: qty 1

## 2012-11-27 MED ORDER — HYDROCODONE-ACETAMINOPHEN 5-325 MG PO TABS *I*
1.0000 | ORAL_TABLET | ORAL | Status: DC | PRN
Start: 2012-11-27 — End: 2012-11-29

## 2012-11-27 NOTE — Progress Notes (Addendum)
Physical Therapy    Initial evaluation completed.     11/27/12 1500   Prior Living    Prior Living Situation Reported by patient   Lives With Spouse;Family   Receives Help From Independent   [place] of [place]   # Steps to Enter Home 8   # Rails to Enter Home 1   # Of Steps In Home 10   # Rails in Home 1   Prior Function Level   Prior Function Level Reported by patient   Transfers Independent   Transfer Devices None   Walking Independent   Walking assistive devices used None   PT Tracking   PT TRACKING PT Assigned   Visit Number   Visit Number 1   Precautions/Observations   Precautions used x   Brace Applied X   Brace Type Back brace   Spinal Precautions Yes   Fall Precautions General falls precautions   Pain Assessment   *Is the patient currently in pain? X   Pain (Before,During, After) Therapy Before;During;After   0-10 Scale 3;6   Pain Location Back;Incision   Pain Orientation Lower;Mid   Pain Descriptors Aching;Constant;Sore   Pain Intervention(s) Repositioned;Refer to nursing for pain management   Additional comments Pt explains that when resting her pain is minimal, but when performing bed mobility her pain increases to 6/10, and while ambulating pain increases only slightly to 4/10.   Cognition   Cognition No deficit noted   LE Assessment   LE Assessment Full AROM RLE;Full AROM LLE   Bed Mobility   Bed mobility x   Supine to Sit Head of bed flat;Contact guard;1 person assist;Side rails up (#)   Sit to Supine 1 person assist;Stand by assistance;Head of bed flat;Side rails up (#)   Transfers   Transfers x   Sit to Stand Modified independent (device)   Stand to sit Modified independent (device)   Mobility   Mobility X   Gait Pattern Decreased cadence;Decreased R step length;Decreased R step height;Decreased L step length;Decreased L step height;Trunk flexed   Ambulation Assist Stand by;1 person assist   Ambulation Distance (Feet) 100'   Ambulation Assistive Device rolling walker   Additional comments Pt  ambulated 100' with rolling walker and stand by assist of one on an even surface without LOB.  Pt ambulates with a step through gait pattern with decreased cadence, step length, and height.    Therapeutic Exercises   Additional comments Pt was educated that at this time the only exercises she should partake in is ambulating.    Balance   Balance x   Sitting - Static Independent ;Unsupported   Standing - Static Independent;Supported   Standing - Dynamic Contact guard;Supported   Assessment   Brief Assessment Appropriate for skilled therapy   Problem List Impaired LE strength;Impaired endurance;Impaired transfers;Impaired ambulation;Impaired functional status;Impaired mobility;Impaired bed mobility;Impaired stair navigation;Impaired functional mobility;Delayed gross motor development skills   Patient / Family Goal d/c for home   Plan/Recommendation   Treatment Interventions Restorative PT;Assess functional mobility;AROM;Positioning;Bed mobility training;Transfers training;Balance training;Stair training;Pt/Family education;Strengthening;Gross motor development activities;Gait training;D/C planning;Will work to minimize pain while promoting mobility whenever possible   PT Frequency 5-7x/wk;30 minute sessions   Hospital Stay Recommendations Out of bed with nursing assist;Ambulate daily with nursing assist;May be out of bed with family assist;May ambulate with family assist;SW   Discharge Recommendations Anticipate return to prior living arrangement   PT Discharge Equipment Recommended Rolling Walker;3 in 1 commode   Assessment/Recommendations Reviewed With: Family;Nursing;Patient  Time Calculation   PT Timed Codes 0   PT Untimed Codes 45   PT Unbilled Time 0   PT Total Treatment 45   PT Charges   PT [facility] Charges Eval 45   Written by: Overton Mam, SPT  I have reviewed and agreed with the physical therapy student's evaluation/treatment note as well as patient education and care plans as documented in the chart.  Barnett Applebaum, PT

## 2012-11-27 NOTE — Progress Notes (Addendum)
Surg PA  POD#1    S: Pt feeling well this AM. Right foot paresthesias resolved. Pain well controlled on PCA. Tol PO well, no flatus, no OOB amb, foley in place. Denies CP/SOB, F/C, N/V, new paresthesias.    O: BP 111/63  Pulse 85  Temp(Src) 36.7 C (98.1 F) (Temporal)  Resp 14  Ht 1.676 m (5\' 6" )  Wt 106.595 kg (235 lb)  BMI 37.95 kg/m2  SpO2 100%  Gen: lying in bed, NAD  CV: RRR  Pulm: CTAB  Abd: soft, NT/ND, + BS  Back: drsg intact, no hematoma noted, JP x1 drng SS  Ext: 5/5 DF/PF/EHL, +/= sensation BLE      Intake/Output Summary (Last 24 hours) at 11/27/12 0631  Last data filed at 11/27/12 0505   Gross per 24 hour   Intake 3566.25 ml   Output   1175 ml   Net 2391.25 ml       A/P: POD#1 s/p TLIF/Lum Lam L5-S1  - d/c foley/IVF/PCA  - OOB with PT/OT  - ADAT  - PO pain control  - XR this AM  - drsg change and d/c drain this afternoon    Donivan Scull, PA  Patient feeling well this am  Some residual numbness right foot - minimal  Excellent progress  Lysbeth Galas, MD

## 2012-11-27 NOTE — Progress Notes (Signed)
Utilization Management    Level of Care Inpatient as of the date [id number]      Matina Rodier J Arrian Manson, RN     Pager: [phone]

## 2012-11-27 NOTE — Plan of Care (Signed)
Problem: Pain/Comfort  Goal: Patient's pain or discomfort is manageable  Outcome: Progressing towards goal  Pt rating pain 3/10 and lower back, prn pain meds given as ordered with good relief. Will continue to monitor and assess.     Problem: Mobility  Goal: Patient's functional status is maintained or improved  Outcome: Progressing towards goal  Ambulating to bathroom and hallway with front-wheel walker, LSO brace and 1 person assist. Tolerating well.     Problem: Post-Operative Bladder Elimination  Goal: Patient is able to empty bladder or return to baseline  Outcome: Maintaining  Pt with spontaneous void of 250cc of clear yellow urine, pt feels she is emptying bladder completely.

## 2012-11-27 NOTE — Plan of Care (Signed)
Problem: Post-Operative Bladder Elimination  Goal: Patient is able to empty bladder or return to baseline  Outcome: Progressing towards goal  1410 Foley removed per order, pt tolerated well. Fluids running, po encouraged. BSC in room. Continue to monitor and assess.

## 2012-11-27 NOTE — Progress Notes (Addendum)
Occupational Therapy  11 am: Occupational therapy order received and chart reviewed; patient sitting in geri-chair with complaint of 7/10 pain - declined activity.  RN notified and indicated patient is due for pain medication shortly.  Occupational therapy will follow up this afternoon as patient tolerates.    130 pm: Assessment attempted, patient declined at this time reporting she had just gotten back to bed with nursing staff assistance following an episode of dizziness.  Occupational therapy will follow up as patient tolerates.

## 2012-11-27 NOTE — Plan of Care (Addendum)
Problem: Impaired Bed Mobility  Goal: STG - IMPROVE BED MOBILITY  Patient will perform bed mobility without rails and the head of bed flat with No assist (Independently)     Time frame: 3-5 days    Problem: Impaired Transfers  Goal: STG - IMPROVE TRANSFERS  Patient will complete Sit to stand transfers using a rolling walker with No assist (Independently)    Time frame: 3-5 days    Problem: Impaired Ambulation  Goal: STG - IMPROVE AMBULATION  Patient will ambulate 150-299 feet using a rolling walker with No assist (Independently)    Time frame: 3-5 days        Problem: Impaired Stair Navigation  Goal: STG - IMPROVE STAIR NAVIGATION  Patient will navigate 8-11 steps with 1  rail(s) and no device and No assist (Independently)    Time frame: 3-5 days      I have reviewed and agreed with the physical therapy student's evaluation/treatment note as well as patient education and care plans as documented in the chart.  Barnett Applebaum, PT

## 2012-11-28 LAB — SURGICAL PATHOLOGY

## 2012-11-28 MED ORDER — AIRGO ROLLING WALKER MISC
Status: AC
Start: 2012-11-28 — End: ?

## 2012-11-28 MED ORDER — ADJUSTABLE COMMODE 3-IN-1 MISC *A*
Status: AC
Start: 2012-11-28 — End: ?

## 2012-11-28 MED ORDER — DOCUSATE SODIUM 100 MG PO CAPS *I*
100.0000 mg | ORAL_CAPSULE | Freq: Two times a day (BID) | ORAL | Status: AC
Start: 2012-11-28 — End: ?

## 2012-11-28 NOTE — Plan of Care (Signed)
Problem: Safety  Goal: Patient will remain free of falls  Outcome: Maintaining  Pt A&Ox3, calls appropriately for assistance, wears non-skid footwear and is rounded on hourly.    Problem: Mobility  Goal: Patient's functional status is maintained or improved  Outcome: Progressing towards goal  Pt having difficulty getting from lying to sitting position.  Pt encouraged not to hold breath while moving, and to continue to walk often.

## 2012-11-28 NOTE — Plan of Care (Signed)
Problem: Safe Discharge Barriers  Goal: Safe Discharge  Outcome: Adequate for discharge Date Met:  11/28/12  Cindy, PT recommended rolling walker and 3-in-1 commode. Pt given directions to ascertain insurance coverage for DME. She requests cost of DME, and vendor response requested. Pt agreed to [facility] as vendor, and since she is the only one who can receive equipment at home, we agreed to delivery to [facility]. Request faxed as of this note for discharge tomorrow. Elbert Ewings, MSW  11/28/2012  10:28 AM

## 2012-11-28 NOTE — Progress Notes (Signed)
PA Progress Note    S: Patient reports doing ok. States pain tolerable, having the most issues with breakthrough pain when getting in and OOB. Reports IV causing her a lot of pain "I want this thing out." Very hesitant to let writer touch back "It's just very sensitive I'd rather you not touch the bandage." Tolerating PO diet well. Voiding well on own. Ambulating with walker. Patient denies fever, chills, CP, SOB, n/v, new paresthesias.     O:  BP 120/62  Pulse 108  Temp(Src) 36.7 C (98.1 F) (Temporal)  Resp 16  Ht 1.676 m (5\' 6" )  Wt 106.595 kg (235 lb)  BMI 37.95 kg/m2  SpO2 97%    General: Alert, NAD, lying in bed  CV:RRR  Resp:CTAB anteriorly  Abd: +BS, soft, ND  Lumbar: Incision C/D/I no noted surrounding erythema or hematoma  Neuro; sensation grossly intact BLE. Strength 5/5 PF/DF BLE  Extr:  Calves NT        Intake/Output Summary (Last 24 hours) at 11/28/12 0649  Last data filed at 11/28/12 0514   Gross per 24 hour   Intake    940 ml   Output   1727 ml   Net   -787 ml       A/P:Stable POD 2 s/p TLIF/Lum Lam L5-S1  -Cont current pain management  -Diet as tolerated  -OOB with assistance. Ambulate with brace intact  -Continue PT  -Will continue to monitor    Wendee Copp RPA-C  NPI # [id number]  Pager # [phone]

## 2012-11-28 NOTE — Progress Notes (Addendum)
Neurosurgery PA Note    Subjective:  Pt is reportedly doing well.  Denies N /V, SOB, chest pain, new weakness.  Is tolerating PO.  Pain is controlled.  Reports pain and hypersensation at posterior incision site. +Flatus -BM    Pain: Last Nursing documented pain:  0-10 Scale: 8 (11/28/12 0315)  Revised FLACC Score: 0 (11/28/12 0415)      Objective:       Last Filed Vitals    11/28/12 0524   BP: 120/62   Pulse: 108   Temp: 36.7 C (98.1 F)   Resp: 16   SpO2: 97%           Component Value Date/Time    HCT 37 11/21/2012 1107         I/O last 3 completed shifts:  07/15 0700 - 07/16 0659  In: 940 (8.8 mL/kg) [P.O.:940]  Out: 1727 (16.2 mL/kg) [Urine:1675 (0.7 mL/kg/hr); Drains:52]  Net: -787  Weight used: 106.6 kg    * Spine Lumbar Standard Ap And Lateral Views    11/27/2012   IMPRESSION:   Posterior fusion hardware in satisfactory alignment.   END OF REPORT    Portable Spine Lumbar Standard Ap And Lateral Views    11/26/2012   IMPRESSION:   Fluoroscopic images as described.  Please refer to procedure notes  for additional details.   END REPORT      Gen: No acute distress.  Alert.  Speech is clear.  Resp: Unlabored breathing.  Incision: Dressing clean, dry and intact.  Strength: Grip 5/5 bilaterally. Dorsiflexion 5/5 R; 5/5 L;Plantar flexion 5/5 R, 5/5 L  Sensation:  UE & LE gross sensation intact bilaterally. Hypersensation surrounding incision site.    Lower ext:  SCDs on patient.  Homan's neg bilaterally. No palpable cords on bilaterally calves.  .    Assessment: POD# 2 S/P TLIF L5-S1 with lum lams L4-S1    Plan:     - Encourage ambulation   - LSO brace when ambulating  - PT/OT as tolerated  - Advance diet as tolerated  - Ice to incision site for pain control prn  - Discharge home tomorrow    Author: Rolene Arbour, PA  as of: 11/28/2012  at: 8:02 AM         Patient doing very well  Plan d/c home in am  Lysbeth Galas, MD

## 2012-11-28 NOTE — Progress Notes (Signed)
Occupational Therapy Evaluation:   11/28/12 1015   OT Tracking   OT Tracking OT Assigned   OT Last Visit   Visit (#) of Five 1   Precautions   Precautions used x   Brace Applied LSO   Spinal Precautions (Avoid bending, lifting, twisting)   Home Living (Prior to Admission)   Prior Living Situation X   Type of Home [place]   Bedroom First floor   Bathroom First floor   Bathroom Shower/Tub Walk-in shower   Prior Function   Prior Function X   Level of Independence Independent with ADLs and functional transfers;Independent ambulation   Lives With Family  (spouse, 47 year old son)   Pain Assessment   *Is the patient currently in pain? X   Pain Location 1 Back   0-10 Scale #1 6   Pain (Before,During, After) Therapy After   Pain Intervention(s) Refer to nursing for pain management;Repositioned   Vision    Current Vision Not tested;No deficit noted   Cognition   Cognition No deficit noted   Additional Comments Episode of reported light headedness in bathroom with dizziness.  Patient said, "it's because I am in here in the corner." Assisted patient back to bed withour reported symptoms.   UE Assessment   UE Assessment Full AROM RUE;Full AROM LUE   Bed Mobility   Sit to Supine Contact Guard   Functional Transfers   Sit to [employer] guard  (commode)   Additional Comments From bathroom with bedside with Rw   ADL Assessment   Eating Independent   Grooming Set up   UE Dressing Set up   LE Dressing Minimal Assist   Toileting Moderate Assist  (for hygiene - unable to address modifications d/t ?anxiety w)   Assessment   Assessment Impaired ADL status;Impaired self-care transfers;Impaired instrumental ADL's   Plan   OT Frequency 3-5x/wk   Multidisciplinary Communication   Multidisciplinary Communication Nurse, Physical Therapist   Recommendation   OT Discharge Recommendations Prior living environment   OT Discharge Equipment Recommended 3:1 Commode;Reacher  (Shower chair)   Time Calculation   OT  Untimed Codes 24   OT Total Treatment 24   OT Charges   OT Charges OT eval -acute

## 2012-11-28 NOTE — Progress Notes (Addendum)
Physical Therapy    Treatment session completed.     11/28/12 1500   PT Tracking   PT TRACKING PT Assigned   Visit Number   Visit Number 2   Precautions/Observations   Precautions used x   Brace Applied X   Brace Type Back brace   Spinal Precautions Yes   Fall Precautions General falls precautions   Pain Assessment   *Is the patient currently in pain? X   Pain (Before,During, After) Therapy Before;During;After   0-10 Scale 3;5   Pain Location Back;Incision   Pain Orientation Lower   Pain Descriptors Aching;Constant;Sore   Pain Intervention(s) Repositioned   Additional comments Pt reports while resting pain is manageable, but upon mobility she feels brace irritates stitches, and stitches feel as though they are stretching causing her to have an increase in pain.    Cognition   Cognition No deficit noted   LE Assessment   LE Assessment Full AROM RLE;Full AROM LLE   Strength LLE   Overall Strength WFL able to perform ADL tasks with strength   Strength RLE   Overall Strength WFL able to perform ADL tasks with strength   Bed Mobility   Bed mobility x   Supine to Sit Independent;Side rails up;Head of bed flat   Sit to Supine Independent;Side rails down;Head of bed flat   Additional comments Pt takes increased amount of time to get from supine<>sit, but is able to complete transfer independently.  Pt reports increase pain with movement from sit<> supine.   Transfers   Transfers x   Sit to Stand Modified independent (device)   Stand to sit Modified independent (device)   Mobility   Mobility X   Gait Pattern Decreased cadence;Decreased R step length;Decreased R step height;Decreased L step length;Decreased L step height;Trunk flexed   Ambulation Assist Modified independent (device)   Ambulation Distance (Feet) 420'   Ambulation Assistive Device rolling walker   Additional comments Pt was able to ambulate independently with rolling walker on an even surface for 420' without any lose of balance.    Therapeutic Exercises    Additional comments Pt was educated to focus on ambulating to help with her recovery.   Balance   Balance x   Sitting - Static Independent ;Unsupported   Standing - Static Independent;Supported   Standing - Dynamic Independent;Supported   Assessment   Brief Assessment Remains appropriate for skilled therapy   Plan/Recommendation   Treatment Interventions Restorative PT;Strengthening;Stair training   PT Frequency 5-7x/wk;30 minute sessions   Hospital Stay Recommendations Out of bed with nursing assist;Ambulate daily with nursing assist;May be out of bed with family assist;May ambulate with family assist   Discharge Recommendations Anticipate return to prior living arrangement   PT Discharge Equipment Recommended Rolling Walker;3 in 1 commode   Assessment/Recommendations Reviewed With: Patient;Nursing;Family   Time Calculation   PT Timed Codes 20   PT Untimed Codes 0   PT Unbilled Time 0   PT Total Treatment 20   PT Charges   PT [facility] Charges Gait training (15 min)   Written by: Overton Mam, SPT  I have reviewed and agreed with the physical therapy student's evaluation/treatment note as well as patient education and care plans as documented in the chart.  Barnett Applebaum, PT

## 2012-11-28 NOTE — Plan of Care (Signed)
Problem: Impaired ADL  Goal: OT STG-Other  STG (1-3 days): Patient will complete toileting in bathroom with modified independence.  STG (1-3 days): Patient will complete LB dressing with modified independence.  STG (1-3 days): Patient will complete grooming independently in standing.  STG (1-3 days): Patient will complete UB/LB bathing with family assistance.  STG (1-3 days): Patient will independently complete ADL setup.

## 2012-11-28 NOTE — Plan of Care (Signed)
Pain/Comfort    . Patient's pain or discomfort is manageable Maintaining          Mobility    . Patient's functional status is maintained or improved Progressing towards goal        Pt ambulated in hall 2x during shift, tolerating movement well. States most pain/discomfort is when getting in and out of bed. Pain is fairly well managed w/ regular PRN pain medications and Flexeril. Calling appropriately for assistance, compliant w/ brace application when OOB.   Pt refusing all bowel medications at this time, stating she "feels fine" in that aspect. Positive for flatus, no BM yet. Will continue to monitor.   Chelsea Aus, RN

## 2012-11-28 NOTE — Plan of Care (Signed)
Problem: Mobility  Goal: Patient's functional status is maintained or improved  Outcome: Progressing towards goal  Pt able to ambulate stand-by assist w rolling walker and brace. Pt able to don brace herself, still needs help getting in and OOB in regard to having a "flat bed" as she will at home. Continue to monitor and assess.     Problem: Post-Operative Bladder Elimination  Goal: Patient is able to empty bladder or return to baseline  Outcome: Adequate for discharge Date Met:  11/28/12  Pt voiding adequately, calling appropriately and able to ambulate to BR.

## 2012-11-29 MED ORDER — CYCLOBENZAPRINE HCL 10 MG PO TABS *I*
10.0000 mg | ORAL_TABLET | Freq: Three times a day (TID) | ORAL | Status: AC | PRN
Start: 2012-11-29 — End: ?

## 2012-11-29 MED ORDER — HYDROCODONE-ACETAMINOPHEN 5-325 MG PO TABS *I*
1.0000 | ORAL_TABLET | ORAL | Status: DC | PRN
Start: 2012-11-29 — End: 2022-05-08

## 2012-11-29 NOTE — Progress Notes (Signed)
Surg PA   POD#3    S: Pt feeling well this AM, ready for d/c home. Pain well controlled, tol PO well, + flatus, + OOB amb, + void. Denies CP/SOB, F/C, N/V, new paresthesias.     O: BP 130/70  Pulse 98  Temp(Src) 37 C (98.6 F) (Temporal)  Resp 14  Ht 1.676 m (5\' 6" )  Wt 106.595 kg (235 lb)  BMI 37.95 kg/m2  SpO2 99%  Gen: lying in bed, NAD   CV: RRR   Pulm: CTAB   Abd: soft, NT/ND, + BS   Back:incision C/D/I, no hematoma noted  Ext: 5/5 DF/PF/EHL, +/= sensation BLE, NCT    A/P: POD#3 s/p TLIF/Lum Lam L5-S1   - d/c home after seen by PT/OT  - f/u Dr Staci Acosta, PA

## 2012-11-29 NOTE — Progress Notes (Signed)
Neurosurgery PA Note    Subjective:  Pt is reportedly doing well.  Denies N /V, SOB, chest pain, new weakness.  Is tolerating PO.  Pain is controlled.  Reports pain at posterior incision site accompanied by hypersensation.  Reports pain with "every movement".    Pain: Last Nursing documented pain:  0-10 Scale: 2 (11/29/12 0500)  Revised FLACC Score: 0 (11/29/12 0130)      Objective:       Last Filed Vitals    11/29/12 0559   BP: 130/70   Pulse:    Temp:    Resp:    SpO2:            Component Value Date/Time    HCT 37 11/21/2012 1107         I/O last 3 completed shifts:  07/16 0700 - 07/17 0659  In: 400 (3.8 mL/kg) [P.O.:400]  Out: 1450 (13.6 mL/kg) [Urine:1450 (0.6 mL/kg/hr)]  Net: -1050  Weight used: 106.6 kg    * Spine Lumbar Standard Ap And Lateral Views    11/27/2012   IMPRESSION:   Posterior fusion hardware in satisfactory alignment.   END OF REPORT    Portable Spine Lumbar Standard Ap And Lateral Views    11/26/2012   IMPRESSION:   Fluoroscopic images as described.  Please refer to procedure notes  for additional details.   END REPORT      Gen: No acute distress.  Alert.  Speech is clear.  Resp: Unlabored breathing.  Incision: Dressing clean, dry and intact.  Strength: Grip 5/5 bilaterally. Dorsiflexion 5/5 R; 5/5 L;Plantar flexion 5/5 R, 5/5 L  Sensation:  UE & LE gross sensation intact bilaterally.    Lower ext:   Homan's neg bilaterally. No palpable cords on bilaterally calves.        Assessment: POD# 3 S/P TLIF L5-S1 with lum lams L4-S1    Plan:     - Encourage ambulation   - LSO brace when ambulating  - PT/OT as tolerated  - ICE to incision site to help with hypersensation  - Discharge home today    Author: Rolene Arbour, PA  as of: 11/29/2012  at: 8:16 AM

## 2012-11-29 NOTE — Progress Notes (Signed)
Progress Note: SW called [facility]) [phone]; SW was given co-pay amounts assuming pt has 80% DME coverage. Pt did not call to find out what her coverage is, but gave her the information. [facility] was JUST starting pt's order; SW informed [facility] pt is ready and waiting for her equipment, and they say they will deliver ASAP. Deb, RN aware. Elbert Ewings, MSW  11/29/2012  9:59 AM

## 2012-11-29 NOTE — Discharge Summary (Signed)
Name: [name] MRN: [id number] DOB: [birth year]     Admit Date: 11/26/2012   Date of Discharge: 11/29/2012    Discharge Attending Physician: Lysbeth Galas      Hospitalization Summary    CONCISE NARRATIVE: Presented for given operative procedure, performed without complication. Progressed well with PT/OT. D/c safely home on POD#3.      OR PROCEDURE: TLIF/Lum Lam L5-S1          Signed: Donivan Scull, PA  On: 11/29/2012  at: 6:50 AM

## 2012-11-29 NOTE — Progress Notes (Addendum)
Physical Therapy    Treatment session completed.     11/29/12 0900   PT Tracking   PT TRACKING PT Assigned   Visit Number   Visit Number 3   Precautions/Observations   Precautions used x   Brace Applied X   Brace Type Back brace   Spinal Precautions Yes   Fall Precautions General falls precautions   Pain Assessment   *Is the patient currently in pain? X   Pain (Before,During, After) Therapy Before;During;After   0-10 Scale 2;5   Pain Location Back;Incision   Pain Orientation Lower   Pain Descriptors Aching;Constant;Sore   Pain Intervention(s) Repositioned   Additional comments Pt reports while resting her pain remains minimal, but during moblity her brace rubs against incision causing an increase in pain level.   Cognition   Cognition No deficit noted   LE Assessment   LE Assessment Full AROM RLE;Full AROM LLE   Strength LLE   Overall Strength WFL able to perform ADL tasks with strength   Strength RLE   Overall Strength WFL able to perform ADL tasks with strength   Bed Mobility   Bed mobility x   Supine to Sit Independent;Side rails down;Head of bed flat   Sit to Supine Not tested   Additional comments Pt takes increased amount of time to complete transfer from ->supine to sit with bed flat, and without hand rail.  Pt reports husband may be building something for her to hold to help increase her efficiency upon moving from supine<>sit.    Transfers   Transfers x   Sit to Stand Modified independent (device)   Stand to sit Modified independent (device)   Mobility   Mobility X   Gait Pattern Decreased cadence;Decreased R step length;Decreased R step height;Decreased L step length;Decreased L step height;Trunk flexed   Ambulation Assist Modified independent (device)   Ambulation Distance (Feet) 100'   Ambulation Assistive Device rolling walker   Stairs Assistance Independent   Stair Management Technique One rail;Step to pattern  (Patient held rail with both hands)   Number of Stairs 10   Additional comments Pt was  capable of ascending and descending stairs with a step to pattern while holding rail with bilater UE's.  Pt was instructed to lead with LLE ascending stairs, and lead with RLE descending stairs due to RLE weakness.    Therapeutic Exercises   Additional comments Pt was re-educated to consistently walk throughout the day to decrease stiffness, and increase healing process upon discharge.   Balance   Balance x   Sitting - Static Independent ;Unsupported   Standing - Static Independent;Supported   Standing - Dynamic Independent;Supported   Additional Comments   Additional Comments Pt was educated to continue with post operative spinal precautions until follow up appointment with surgeon.  Pt was capable of verbalizing precautions without difficulty or cues.    Assessment   Brief Assessment Patient demonstrates adequate mobility skills to return home   Patient / Family Goal d/c to home   Plan/Recommendation   Treatment Interventions No further PT interventions;Pt/Family education;D/C planning   PT Frequency none further   Hospital Stay Recommendations Out of bed with nursing assist;Ambulate daily with nursing assist;May ambulate with family assist;May be out of bed with family assist   Discharge Recommendations Anticipate return to prior living arrangement   PT Discharge Equipment Recommended 3 in 1 commode;Rolling Walker   Assessment/Recommendations Reviewed With: Patient;Nursing   Time Calculation   PT Timed Codes 20   PT  Untimed Codes 0   PT Unbilled Time 0   PT Total Treatment 20   PT Charges   PT [facility] Charges Gait training (15 min)   Written by: Overton Mam, SPT  I have reviewed and agreed with the physical therapy student's evaluation/treatment note as well as patient education and care plans as documented in the chart.  Barnett Applebaum, PT

## 2012-11-29 NOTE — Plan of Care (Signed)
Problem: Pain/Comfort  Goal: Patient's pain or discomfort is manageable  Outcome: Adequate for discharge Date Met:  11/29/12  Reviewed d/c instrucs w pt who verbalized understanding. Scripts picked up at [employer].

## 2012-11-29 NOTE — Plan of Care (Signed)
Problem: Safety  Goal: Patient will remain free of falls  Outcome: Completed or Resolved Date Met:  11/29/12  Pt A&Ox3, calls appropriately for assistance, wears non-skid footwear, rounded on hourly.

## 2012-11-29 NOTE — Progress Notes (Signed)
Occupational Therapy Treatment:   11/29/12 0920   OT Tracking   OT Tracking OT Discontinue   OT Last Visit   Visit (#) of Five 2   Precautions   Precautions used x   Brace Applied LSO  (Independent management)   Spinal Precautions (Avoid bending, lifting, twisting)   Home Living (Prior to Admission)   Bathroom Shower/Tub Tub/Shower unit   Pain Assessment   *Is the patient currently in pain? X   Pain Location 1 Back   0-10 Scale #1 5   Pain (Before,During, After) Therapy During   Pain Intervention(s) Refer to nursing for pain management   ADL Assessment   Eating Independent   Grooming Set up  (needs to be sitting for balance)   UE Dressing Independent   LE Dressing Independent;Modified independent  (choose to wear skirt)   Toileting Modified independence  (SBA for balance, modified strategies for hygiene)   IADL Assessment   IADL x   Additional Comments Needs assistance at this time for meal prep due to decreased standing balance   Bed Mobility   Sit to Supine Modified Independent   Functional Transfers   Sit to Stand Modified Independent  (SBA)   Toilet Transfers Modified Independent   Additional Comments To bathroom with SBA with RW   Balance   Sitting - Static Independent    Sitting - Dynamic Independent   Standing - Static Supervision;Supported  (hands on RW)   Cognition   Cognition No deficit noted   UE Assessment   UE Assessment Full AROM RUE;Full AROM LUE   Assessment   Assessment Patient has met all inpatient goals   Recommendations   Recommendations Continue with current recommendations;Prior Living Environment   Additional Comments Commode, shower chair, reacher   Multidisciplinary Communication   Multidisciplinary Communication Nurse, Physical Therapist   Time Calculation   OT Timed Codes 15   OT Untimed Codes 0   OT Unbilled Time 0   OT Total Treatment 15   OT Charges   OT Charges ADL training

## 2014-02-05 ENCOUNTER — Other Ambulatory Visit (HOSPITAL_COMMUNITY): Payer: Self-pay | Admitting: Physician Assistant

## 2014-02-05 DIAGNOSIS — Z1231 Encounter for screening mammogram for malignant neoplasm of breast: Secondary | ICD-10-CM

## 2014-02-21 ENCOUNTER — Ambulatory Visit (HOSPITAL_COMMUNITY): Payer: Self-pay

## 2014-03-07 ENCOUNTER — Ambulatory Visit (HOSPITAL_COMMUNITY): Admission: RE | Admit: 2014-03-07 | Payer: Self-pay | Source: Ambulatory Visit

## 2014-03-19 ENCOUNTER — Ambulatory Visit (HOSPITAL_COMMUNITY): Admission: RE | Admit: 2014-03-19 | Payer: Self-pay | Source: Ambulatory Visit

## 2017-05-15 LAB — UNMAPPED LAB RESULTS
Hematocrit (HT): 32.9 % — ABNORMAL LOW (ref 34.0–47.0)
Hemoglobin (HGB) (HT): 11 g/dL — ABNORMAL LOW (ref 11.5–16.0)
MCHC (HT): 33.4 g/dL (ref 32.0–36.0)
MCV (HT): 85 FL (ref 81.0–99.0)
Mean Corpuscular Hemoglobin (MCH) (HT): 28.4 pg (ref 26.0–34.0)
Platelets (HT): 329 10 3/uL (ref 140–400)
RBC (HT): 3.87 10 6/uL (ref 3.80–5.20)
RDW (HT): 13.2 % (ref 11.5–15.0)
WBC (HT): 7 10 3/uL (ref 4.0–10.8)

## 2017-05-26 LAB — UNMAPPED LAB RESULTS
Hematocrit (HT): 33 % — ABNORMAL LOW (ref 34.0–47.0)
Hemoglobin (HGB) (HT): 10.8 g/dL — ABNORMAL LOW (ref 11.5–16.0)
MCHC (HT): 32.7 g/dL (ref 32.0–36.0)
MCV (HT): 85.1 FL (ref 81.0–99.0)
Mean Corpuscular Hemoglobin (MCH) (HT): 27.8 pg (ref 26.0–34.0)
Platelets (HT): 328 10 3/uL (ref 140–400)
RBC (HT): 3.88 10 6/uL (ref 3.80–5.20)
RDW (HT): 13.3 % (ref 11.5–15.0)
WBC (HT): 6.8 10 3/uL (ref 4.0–10.8)

## 2017-11-02 LAB — UNMAPPED LAB RESULTS
Hematocrit (HT): 34.6 % (ref 34.0–47.0)
Hemoglobin (HGB) (HT): 11.3 g/dL — ABNORMAL LOW (ref 11.5–16.0)
MCHC (HT): 32.7 g/dL (ref 32.0–36.0)
MCV (HT): 85.6 FL (ref 81.0–99.0)
Mean Corpuscular Hemoglobin (MCH) (HT): 28 pg (ref 26.0–34.0)
Platelets (HT): 315 10 3/uL (ref 140–400)
RBC (HT): 4.04 10 6/uL (ref 3.80–5.20)
RDW (HT): 12.9 % (ref 11.5–15.0)
WBC (HT): 8.4 10 3/uL (ref 4.0–10.8)

## 2018-02-20 LAB — UNMAPPED LAB RESULTS
Hematocrit (HT): 34 % (ref 34.0–47.0)
Hemoglobin (HGB) (HT): 11.3 g/dL — ABNORMAL LOW (ref 11.5–16.0)
MCHC (HT): 33.2 g/dL (ref 32.0–36.0)
MCV (HT): 86.5 FL (ref 81.0–99.0)
Mean Corpuscular Hemoglobin (MCH) (HT): 28.8 pg (ref 26.0–34.0)
Platelets (HT): 248 10 3/uL (ref 140–400)
RBC (HT): 3.93 10 6/uL (ref 3.80–5.20)
RDW (HT): 12.9 % (ref 11.5–15.0)
WBC (HT): 6.6 10 3/uL (ref 4.0–10.8)

## 2018-09-15 LAB — UNMAPPED LAB RESULTS
Basophil # (HT): 0 10 3/uL (ref 0.0–0.2)
Basophil % (HT): 0 % (ref 0–2)
Eosinophil # (HT): 0.1 10 3/uL (ref 0.0–0.5)
Eosinophil % (HT): 2 % (ref 0–7)
Hematocrit (HT): 36 % — ABNORMAL LOW (ref 40–52)
Hemoglobin (HGB) (HT): 12.2 g/dL (ref 11.5–16.0)
Lymphocyte # (HT): 2.2 10 3/uL (ref 0.9–3.8)
Lymphocyte % (HT): 27 % (ref 17–44)
MCHC (HT): 34.1 g/dL (ref 32.0–36.0)
MCV (HT): 87 fL (ref 81–99)
Mean Corpuscular Hemoglobin (MCH) (HT): 29.7 pg (ref 26.0–34.0)
Monocyte # (HT): 0.5 10 3/uL (ref 0.2–1.0)
Monocyte % (HT): 6 % (ref 4–15)
Neutrophil # (HT): 5.2 10 3/uL (ref 1.5–7.7)
Platelets (HT): 251 10 3/uL (ref 140–400)
RBC (HT): 4.11 10 6/uL (ref 3.80–5.20)
RDW (HT): 12.3 % (ref 11.5–15.0)
Seg Neut % (HT): 65 % (ref 43–75)
WBC (HT): 8.1 10 3/uL (ref 4.0–10.0)

## 2019-02-06 LAB — UNMAPPED LAB RESULTS
Hematocrit (HT): 36.5 % (ref 34.0–47.0)
Hemoglobin (HGB) (HT): 12.5 g/dL (ref 11.5–16.0)
MCHC (HT): 34.2 g/dL (ref 32.0–36.0)
MCV (HT): 85.1 FL (ref 81.0–99.0)
Mean Corpuscular Hemoglobin (MCH) (HT): 29.1 pg (ref 26.0–34.0)
Platelets (HT): 281 10 3/uL (ref 140–400)
RBC (HT): 4.29 10 6/uL (ref 3.80–5.20)
RDW (HT): 13.3 % (ref 11.5–15.0)
WBC (HT): 7.2 10 3/uL (ref 4.0–10.8)

## 2019-10-22 LAB — UNMAPPED LAB RESULTS
Hematocrit (HT): 36 % (ref 34–47)
Hemoglobin (HGB) (HT): 12.1 g/dL (ref 11.5–16.0)
MCHC (HT): 33.3 g/dL (ref 32.0–36.0)
MCV (HT): 90.1 fL (ref 81.0–99.0)
Mean Corpuscular Hemoglobin (MCH) (HT): 30 pg (ref 26.0–34.0)
Platelets (HT): 281 10 3/uL (ref 140–400)
RBC (HT): 4.03 10 6/uL (ref 3.80–5.20)
RDW (HT): 12 % (ref 11.5–15.0)
WBC (HT): 6.2 10 3/uL (ref 4.0–10.8)

## 2020-07-27 LAB — UNMAPPED LAB RESULTS
Basophil # (HT): 0 10 3/uL (ref 0.0–0.2)
Basophil % (HT): 1 % (ref 0–2)
Eosinophil # (HT): 0.2 10 3/uL (ref 0.0–0.5)
Eosinophil % (HT): 3 % (ref 0–7)
Hematocrit (HT): 37 % — ABNORMAL LOW (ref 40–52)
Hemoglobin (HGB) (HT): 12.2 g/dL (ref 11.5–16.0)
Lymphocyte # (HT): 2.7 10 3/uL (ref 0.9–3.8)
Lymphocyte % (HT): 36 % (ref 17–44)
MCHC (HT): 33.3 g/dL (ref 32.0–36.0)
MCV (HT): 89 fL (ref 81–99)
Mean Corpuscular Hemoglobin (MCH) (HT): 29.5 pg (ref 26.0–34.0)
Monocyte # (HT): 0.6 10 3/uL (ref 0.2–1.0)
Monocyte % (HT): 7 % (ref 4–15)
Neutrophil # (HT): 4.1 10 3/uL (ref 1.5–7.7)
Platelets (HT): 313 10 3/uL (ref 140–400)
RBC (HT): 4.13 10 6/uL (ref 3.80–5.20)
RDW (HT): 12.2 % (ref 11.5–15.0)
Seg Neut % (HT): 53 % (ref 43–75)
WBC (HT): 7.7 10 3/uL (ref 4.0–10.0)

## 2020-08-07 LAB — UNMAPPED LAB RESULTS
Basophil # (HT): 0.1 10 3/uL (ref 0.0–0.2)
Basophil % (HT): 1 % (ref 0–2)
Eosinophil # (HT): 0.2 10 3/uL (ref 0.0–0.5)
Eosinophil % (HT): 3 % (ref 0–7)
Hematocrit (HT): 37 % — ABNORMAL LOW (ref 40–52)
Hemoglobin (HGB) (HT): 11.7 g/dL (ref 11.5–16.0)
Lymphocyte # (HT): 2.5 10 3/uL (ref 0.9–3.8)
Lymphocyte % (HT): 37 % (ref 17–44)
MCHC (HT): 31.6 g/dL — ABNORMAL LOW (ref 32.0–36.0)
MCV (HT): 89 fL (ref 81–99)
Mean Corpuscular Hemoglobin (MCH) (HT): 28.1 pg (ref 26.0–34.0)
Monocyte # (HT): 0.4 10 3/uL (ref 0.2–1.0)
Monocyte % (HT): 6 % (ref 4–15)
Neutrophil # (HT): 3.6 10 3/uL (ref 1.5–7.7)
Platelets (HT): 315 10 3/uL (ref 140–400)
RBC (HT): 4.16 10 6/uL (ref 3.80–5.20)
RDW (HT): 12 % (ref 11.5–15.0)
Seg Neut % (HT): 53 % (ref 43–75)
WBC (HT): 6.9 10 3/uL (ref 4.0–10.0)

## 2020-08-14 LAB — UNMAPPED LAB RESULTS
Basophil # (HT): 0 10 3/uL (ref 0.0–0.2)
Basophil % (HT): 1 % (ref 0–2)
Eosinophil # (HT): 0.2 10 3/uL (ref 0.0–0.5)
Eosinophil % (HT): 3 % (ref 0–7)
Hematocrit (HT): 36 % — ABNORMAL LOW (ref 40–52)
Hemoglobin (HGB) (HT): 11.6 g/dL (ref 11.5–16.0)
Lymphocyte # (HT): 2.3 10 3/uL (ref 0.9–3.8)
Lymphocyte % (HT): 36 % (ref 17–44)
MCHC (HT): 32.5 g/dL (ref 32.0–36.0)
MCV (HT): 87 fL (ref 81–99)
Mean Corpuscular Hemoglobin (MCH) (HT): 28.2 pg (ref 26.0–34.0)
Monocyte # (HT): 0.2 10 3/uL (ref 0.2–1.0)
Monocyte % (HT): 3 % — ABNORMAL LOW (ref 4–15)
Neutrophil # (HT): 3.7 10 3/uL (ref 1.5–7.7)
Platelets (HT): 277 10 3/uL (ref 140–400)
RBC (HT): 4.11 10 6/uL (ref 3.80–5.20)
RDW (HT): 12.1 % (ref 11.5–15.0)
Seg Neut % (HT): 58 % (ref 43–75)
WBC (HT): 6.5 10 3/uL (ref 4.0–10.0)

## 2020-08-21 LAB — UNMAPPED LAB RESULTS
Basophil # (HT): 0 10 3/uL (ref 0.0–0.2)
Basophil % (HT): 1 % (ref 0–2)
Eosinophil # (HT): 0.1 10 3/uL (ref 0.0–0.5)
Eosinophil % (HT): 2 % (ref 0–7)
Hematocrit (HT): 37 % — ABNORMAL LOW (ref 40–52)
Hemoglobin (HGB) (HT): 12.2 g/dL (ref 11.5–16.0)
Lymphocyte # (HT): 2.7 10 3/uL (ref 0.9–3.8)
Lymphocyte % (HT): 43 % (ref 17–44)
MCHC (HT): 32.6 g/dL (ref 32.0–36.0)
MCV (HT): 87 fL (ref 81–99)
Mean Corpuscular Hemoglobin (MCH) (HT): 28.2 pg (ref 26.0–34.0)
Monocyte # (HT): 0.2 10 3/uL (ref 0.2–1.0)
Monocyte % (HT): 4 % (ref 4–15)
Neutrophil # (HT): 3.2 10 3/uL (ref 1.5–7.7)
Platelets (HT): 365 10 3/uL (ref 140–400)
RBC (HT): 4.32 10 6/uL (ref 3.80–5.20)
RDW (HT): 12.1 % (ref 11.5–15.0)
Seg Neut % (HT): 51 % (ref 43–75)
WBC (HT): 6.3 10 3/uL (ref 4.0–10.0)

## 2020-08-28 LAB — UNMAPPED LAB RESULTS
Basophil # (HT): 0 10 3/uL (ref 0.0–0.2)
Basophil % (HT): 1 % (ref 0–2)
Eosinophil # (HT): 0.1 10 3/uL (ref 0.0–0.5)
Eosinophil % (HT): 2 % (ref 0–7)
Hematocrit (HT): 37 % — ABNORMAL LOW (ref 40–52)
Hemoglobin (HGB) (HT): 12 g/dL (ref 11.5–16.0)
Lymphocyte # (HT): 2.2 10 3/uL (ref 0.9–3.8)
Lymphocyte % (HT): 46 % — ABNORMAL HIGH (ref 17–44)
MCHC (HT): 32.7 g/dL (ref 32.0–36.0)
MCV (HT): 85 fL (ref 81–99)
Mean Corpuscular Hemoglobin (MCH) (HT): 27.9 pg (ref 26.0–34.0)
Monocyte # (HT): 0.2 10 3/uL (ref 0.2–1.0)
Monocyte % (HT): 4 % (ref 4–15)
Neutrophil # (HT): 2.3 10 3/uL (ref 1.5–7.7)
Platelets (HT): 340 10 3/uL (ref 140–400)
RBC (HT): 4.3 10 6/uL (ref 3.80–5.20)
RDW (HT): 12.4 % (ref 11.5–15.0)
Seg Neut % (HT): 48 % (ref 43–75)
WBC (HT): 4.8 10 3/uL (ref 4.0–10.0)

## 2020-09-04 LAB — UNMAPPED LAB RESULTS
Basophil # (HT): 0 10 3/uL (ref 0.0–0.2)
Basophil % (HT): 0 % (ref 0–2)
Eosinophil # (HT): 0.1 10 3/uL (ref 0.0–0.5)
Eosinophil % (HT): 1 % (ref 0–7)
Hematocrit (HT): 37 % — ABNORMAL LOW (ref 40–52)
Hemoglobin (HGB) (HT): 12.1 g/dL (ref 11.5–16.0)
Lymphocyte # (HT): 2.4 10 3/uL (ref 0.9–3.8)
Lymphocyte % (HT): 48 % — ABNORMAL HIGH (ref 17–44)
MCHC (HT): 32.6 g/dL (ref 32.0–36.0)
MCV (HT): 86 fL (ref 81–99)
Mean Corpuscular Hemoglobin (MCH) (HT): 28.1 pg (ref 26.0–34.0)
Monocyte # (HT): 0.1 10 3/uL — ABNORMAL LOW (ref 0.2–1.0)
Monocyte % (HT): 3 % — ABNORMAL LOW (ref 4–15)
Neutrophil # (HT): 2.4 10 3/uL (ref 1.5–7.7)
Platelets (HT): 315 10 3/uL (ref 140–400)
RBC (HT): 4.31 10 6/uL (ref 3.80–5.20)
RDW (HT): 12.4 % (ref 11.5–15.0)
Seg Neut % (HT): 48 % (ref 43–75)
WBC (HT): 5 10 3/uL (ref 4.0–10.0)

## 2020-09-11 LAB — UNMAPPED LAB RESULTS
Basophil # (HT): 0 10 3/uL (ref 0.0–0.2)
Basophil % (HT): 0 % (ref 0–2)
Eosinophil # (HT): 0.1 10 3/uL (ref 0.0–0.5)
Eosinophil % (HT): 1 % (ref 0–7)
Hematocrit (HT): 36 % — ABNORMAL LOW (ref 40–52)
Hemoglobin (HGB) (HT): 11.7 g/dL (ref 11.5–16.0)
Lymphocyte # (HT): 2.3 10 3/uL (ref 0.9–3.8)
Lymphocyte % (HT): 47 % — ABNORMAL HIGH (ref 17–44)
MCHC (HT): 32.4 g/dL (ref 32.0–36.0)
MCV (HT): 87 fL (ref 81–99)
Mean Corpuscular Hemoglobin (MCH) (HT): 28.1 pg (ref 26.0–34.0)
Monocyte # (HT): 0.2 10 3/uL (ref 0.2–1.0)
Monocyte % (HT): 5 % (ref 4–15)
Neutrophil # (HT): 2.3 10 3/uL (ref 1.5–7.7)
Platelets (HT): 316 10 3/uL (ref 140–400)
RBC (HT): 4.16 10 6/uL (ref 3.80–5.20)
RDW (HT): 12.5 % (ref 11.5–15.0)
Seg Neut % (HT): 47 % (ref 43–75)
WBC (HT): 5 10 3/uL (ref 4.0–10.0)

## 2020-09-18 LAB — UNMAPPED LAB RESULTS
Basophil # (HT): 0 10 3/uL (ref 0.0–0.2)
Basophil % (HT): 0 % (ref 0–2)
Eosinophil # (HT): 0 10 3/uL (ref 0.0–0.5)
Eosinophil % (HT): 0 % (ref 0–7)
Hematocrit (HT): 35 % — ABNORMAL LOW (ref 40–52)
Hematocrit (HT): 36 % — ABNORMAL LOW (ref 40–52)
Hemoglobin (HGB) (HT): 11.7 g/dL (ref 11.5–16.0)
Hemoglobin (HGB) (HT): 11.8 g/dL (ref 11.5–16.0)
Lymphocyte # (HT): 1.6 10 3/uL (ref 0.9–3.8)
Lymphocyte % (HT): 29 % (ref 17–44)
MCHC (HT): 33.2 g/dL (ref 32.0–36.0)
MCHC (HT): 33.3 g/dL (ref 32.0–36.0)
MCV (HT): 86 fL (ref 81–99)
MCV (HT): 86 fL (ref 81–99)
Mean Corpuscular Hemoglobin (MCH) (HT): 28.6 pg (ref 26.0–34.0)
Mean Corpuscular Hemoglobin (MCH) (HT): 28.7 pg (ref 26.0–34.0)
Monocyte # (HT): 0.2 10 3/uL (ref 0.2–1.0)
Monocyte % (HT): 3 % — ABNORMAL LOW (ref 4–15)
Neutrophil # (HT): 3.8 10 3/uL (ref 1.5–7.7)
Platelets (HT): 329 10 3/uL (ref 140–400)
Platelets (HT): 353 10 3/uL (ref 140–400)
RBC (HT): 4.08 10 6/uL (ref 3.80–5.20)
RBC (HT): 4.12 10 6/uL (ref 3.80–5.20)
RDW (HT): 12.7 % (ref 11.5–15.0)
RDW (HT): 12.9 % (ref 11.5–15.0)
Seg Neut % (HT): 67 % (ref 43–75)
WBC (HT): 5.6 10 3/uL (ref 4.0–10.0)
WBC (HT): 6.1 10 3/uL (ref 4.0–10.0)

## 2020-09-25 LAB — UNMAPPED LAB RESULTS
Basophil # (HT): 0 10 3/uL (ref 0.0–0.2)
Basophil % (HT): 0 % (ref 0–2)
Eosinophil # (HT): 0 10 3/uL (ref 0.0–0.5)
Eosinophil % (HT): 0 % (ref 0–7)
Hematocrit (HT): 37 % — ABNORMAL LOW (ref 40–52)
Hemoglobin (HGB) (HT): 12 g/dL (ref 11.5–16.0)
Lymphocyte # (HT): 1.5 10 3/uL (ref 0.9–3.8)
Lymphocyte % (HT): 23 % (ref 17–44)
MCHC (HT): 32.6 g/dL (ref 32.0–36.0)
MCV (HT): 86 fL (ref 81–99)
Mean Corpuscular Hemoglobin (MCH) (HT): 28.2 pg (ref 26.0–34.0)
Monocyte # (HT): 0.2 10 3/uL (ref 0.2–1.0)
Monocyte % (HT): 2 % — ABNORMAL LOW (ref 4–15)
Neutrophil # (HT): 5 10 3/uL (ref 1.5–7.7)
Platelets (HT): 353 10 3/uL (ref 140–400)
RBC (HT): 4.26 10 6/uL (ref 3.80–5.20)
RDW (HT): 13 % (ref 11.5–15.0)
Seg Neut % (HT): 74 % (ref 43–75)
WBC (HT): 6.7 10 3/uL (ref 4.0–10.0)

## 2020-10-02 LAB — UNMAPPED LAB RESULTS
Basophil # (HT): 0 10 3/uL (ref 0.0–0.2)
Basophil % (HT): 0 % (ref 0–2)
Eosinophil # (HT): 0 10 3/uL (ref 0.0–0.5)
Eosinophil % (HT): 0 % (ref 0–7)
Hematocrit (HT): 37 % (ref 34–47)
Hemoglobin (HGB) (HT): 12 g/dL (ref 11.5–16.0)
Lymphocyte # (HT): 1.3 10 3/uL (ref 0.9–3.8)
Lymphocyte % (HT): 24 % (ref 17–44)
MCHC (HT): 32.6 g/dL (ref 32.0–36.0)
MCV (HT): 84.2 fL (ref 81.0–99.0)
Mean Corpuscular Hemoglobin (MCH) (HT): 27.5 pg (ref 26.0–34.0)
Monocyte # (HT): 0.1 10 3/uL — ABNORMAL LOW (ref 0.2–1.0)
Monocyte % (HT): 3 % — ABNORMAL LOW (ref 4–12)
Neutrophil # (HT): 3.9 10 3/uL (ref 1.5–7.7)
Platelets (HT): 311 10 3/uL (ref 140–400)
RBC (HT): 4.37 10 6/uL (ref 3.80–5.20)
RDW (HT): 13.6 % (ref 11.5–15.0)
Seg Neut % (HT): 72 % (ref 40–75)
WBC (HT): 5.4 10 3/uL (ref 4.0–10.8)

## 2020-10-09 LAB — UNMAPPED LAB RESULTS
Basophil # (HT): 0 10 3/uL (ref 0.0–0.2)
Basophil % (HT): 0 % (ref 0–2)
Eosinophil # (HT): 0 10 3/uL (ref 0.0–0.5)
Eosinophil % (HT): 0 % (ref 0–7)
Hematocrit (HT): 35 % — ABNORMAL LOW (ref 40–52)
Hemoglobin (HGB) (HT): 11.2 g/dL — ABNORMAL LOW (ref 11.5–16.0)
Lymphocyte # (HT): 1.2 10 3/uL (ref 0.9–3.8)
Lymphocyte % (HT): 26 % (ref 17–44)
MCHC (HT): 31.9 g/dL — ABNORMAL LOW (ref 32.0–36.0)
MCV (HT): 87 fL (ref 81–99)
Mean Corpuscular Hemoglobin (MCH) (HT): 27.6 pg (ref 26.0–34.0)
Monocyte # (HT): 0.1 10 3/uL — ABNORMAL LOW (ref 0.2–1.0)
Monocyte % (HT): 3 % — ABNORMAL LOW (ref 4–15)
Neutrophil # (HT): 3.3 10 3/uL (ref 1.5–7.7)
Platelets (HT): 264 10 3/uL (ref 140–400)
RBC (HT): 4.06 10 6/uL (ref 3.80–5.20)
RDW (HT): 13.1 % (ref 11.5–15.0)
Seg Neut % (HT): 71 % (ref 43–75)
WBC (HT): 4.6 10 3/uL (ref 4.0–10.0)

## 2020-10-16 LAB — UNMAPPED LAB RESULTS
Basophil # (HT): 0 10 3/uL (ref 0.0–0.2)
Basophil % (HT): 1 % (ref 0–2)
Eosinophil # (HT): 0.1 10 3/uL (ref 0.0–0.5)
Eosinophil % (HT): 1 % (ref 0–7)
Hematocrit (HT): 36 % — ABNORMAL LOW (ref 40–52)
Hemoglobin (HGB) (HT): 11.9 g/dL (ref 11.5–16.0)
Lymphocyte # (HT): 1.9 10 3/uL (ref 0.9–3.8)
Lymphocyte % (HT): 34 % (ref 17–44)
MCHC (HT): 33.3 g/dL (ref 32.0–36.0)
MCV (HT): 85 fL (ref 81–99)
Mean Corpuscular Hemoglobin (MCH) (HT): 28.3 pg (ref 26.0–34.0)
Monocyte # (HT): 0.2 10 3/uL (ref 0.2–1.0)
Monocyte % (HT): 4 % (ref 4–15)
Neutrophil # (HT): 3.3 10 3/uL (ref 1.5–7.7)
Platelets (HT): 323 10 3/uL (ref 140–400)
RBC (HT): 4.2 10 6/uL (ref 3.80–5.20)
RDW (HT): 13.4 % (ref 11.5–15.0)
Seg Neut % (HT): 60 % (ref 43–75)
WBC (HT): 5.6 10 3/uL (ref 4.0–10.0)

## 2020-10-23 LAB — UNMAPPED LAB RESULTS
Basophil # (HT): 0 10 3/uL (ref 0.0–0.2)
Basophil % (HT): 0 % (ref 0–2)
Eosinophil # (HT): 0 10 3/uL (ref 0.0–0.5)
Eosinophil % (HT): 0 % (ref 0–7)
Hematocrit (HT): 34 % — ABNORMAL LOW (ref 40–52)
Hemoglobin (HGB) (HT): 10.9 g/dL — ABNORMAL LOW (ref 11.5–16.0)
Lymphocyte # (HT): 1.3 10 3/uL (ref 0.9–3.8)
Lymphocyte % (HT): 27 % (ref 17–44)
MCHC (HT): 32.5 g/dL (ref 32.0–36.0)
MCV (HT): 87 fL (ref 81–99)
Mean Corpuscular Hemoglobin (MCH) (HT): 28.3 pg (ref 26.0–34.0)
Monocyte # (HT): 0.2 10 3/uL (ref 0.2–1.0)
Monocyte % (HT): 4 % (ref 4–15)
Neutrophil # (HT): 3.2 10 3/uL (ref 1.5–7.7)
Platelets (HT): 306 10 3/uL (ref 140–400)
RBC (HT): 3.85 10 6/uL (ref 3.80–5.20)
RDW (HT): 13.7 % (ref 11.5–15.0)
Seg Neut % (HT): 68 % (ref 43–75)
WBC (HT): 4.6 10 3/uL (ref 4.0–10.0)

## 2020-11-13 LAB — UNMAPPED LAB RESULTS
Basophil # (HT): 0 10 3/uL (ref 0.0–0.2)
Basophil % (HT): 1 % (ref 0–2)
Eosinophil # (HT): 0.1 10 3/uL (ref 0.0–0.5)
Eosinophil % (HT): 2 % (ref 0–7)
Hematocrit (HT): 32 % — ABNORMAL LOW (ref 40–52)
Hemoglobin (HGB) (HT): 10.3 g/dL — ABNORMAL LOW (ref 11.5–16.0)
Lymphocyte # (HT): 2.7 10 3/uL (ref 0.9–3.8)
Lymphocyte % (HT): 47 % — ABNORMAL HIGH (ref 17–44)
MCHC (HT): 32 g/dL (ref 32.0–36.0)
MCV (HT): 89 fL (ref 81–99)
Mean Corpuscular Hemoglobin (MCH) (HT): 28.5 pg (ref 26.0–34.0)
Monocyte # (HT): 0.4 10 3/uL (ref 0.2–1.0)
Monocyte % (HT): 8 % (ref 4–15)
Neutrophil # (HT): 2.4 10 3/uL (ref 1.5–7.7)
Platelets (HT): 248 10 3/uL (ref 140–400)
RBC (HT): 3.62 10 6/uL — ABNORMAL LOW (ref 3.80–5.20)
RDW (HT): 15.6 % — ABNORMAL HIGH (ref 11.5–15.0)
Seg Neut % (HT): 42 % — ABNORMAL LOW (ref 43–75)
WBC (HT): 5.8 10 3/uL (ref 4.0–10.0)

## 2020-12-04 LAB — UNMAPPED LAB RESULTS
Basophil # (HT): 0 10 3/uL (ref 0.0–0.2)
Basophil % (HT): 1 % (ref 0–2)
Eosinophil # (HT): 0.3 10 3/uL (ref 0.0–0.5)
Eosinophil % (HT): 5 % (ref 0–7)
Hematocrit (HT): 32 % — ABNORMAL LOW (ref 40–52)
Hemoglobin (HGB) (HT): 9.9 g/dL — ABNORMAL LOW (ref 11.5–16.0)
Lymphocyte # (HT): 2.6 10 3/uL (ref 0.9–3.8)
Lymphocyte % (HT): 37 % (ref 17–44)
MCHC (HT): 31.4 g/dL — ABNORMAL LOW (ref 32.0–36.0)
MCV (HT): 91 fL (ref 81–99)
Mean Corpuscular Hemoglobin (MCH) (HT): 28.4 pg (ref 26.0–34.0)
Monocyte # (HT): 0.5 10 3/uL (ref 0.2–1.0)
Monocyte % (HT): 8 % (ref 4–15)
Neutrophil # (HT): 3.6 10 3/uL (ref 1.5–7.7)
Platelets (HT): 353 10 3/uL (ref 140–400)
RBC (HT): 3.48 10 6/uL — ABNORMAL LOW (ref 3.80–5.20)
RDW (HT): 14 % (ref 11.5–15.0)
Seg Neut % (HT): 51 % (ref 43–75)
WBC (HT): 7.1 10 3/uL (ref 4.0–10.0)

## 2020-12-25 LAB — UNMAPPED LAB RESULTS
Basophil # (HT): 0 10 3/uL (ref 0.0–0.2)
Basophil % (HT): 1 % (ref 0–2)
Eosinophil # (HT): 0.2 10 3/uL (ref 0.0–0.5)
Eosinophil % (HT): 4 % (ref 0–7)
Hematocrit (HT): 32 % — ABNORMAL LOW (ref 40–52)
Hemoglobin (HGB) (HT): 10 g/dL — ABNORMAL LOW (ref 11.5–16.0)
Lymphocyte # (HT): 2.3 10 3/uL (ref 0.9–3.8)
Lymphocyte % (HT): 40 % (ref 17–44)
MCHC (HT): 31.7 g/dL — ABNORMAL LOW (ref 32.0–36.0)
MCV (HT): 89 fL (ref 81–99)
Mean Corpuscular Hemoglobin (MCH) (HT): 28.3 pg (ref 26.0–34.0)
Monocyte # (HT): 0.4 10 3/uL (ref 0.2–1.0)
Monocyte % (HT): 7 % (ref 4–15)
Neutrophil # (HT): 2.7 10 3/uL (ref 1.5–7.7)
Platelets (HT): 332 10 3/uL (ref 140–400)
RBC (HT): 3.53 10 6/uL — ABNORMAL LOW (ref 3.80–5.20)
RDW (HT): 13.4 % (ref 11.5–15.0)
Seg Neut % (HT): 48 % (ref 43–75)
WBC (HT): 5.7 10 3/uL (ref 4.0–10.0)

## 2021-01-15 LAB — UNMAPPED LAB RESULTS
Basophil # (HT): 0 10 3/uL (ref 0.0–0.2)
Basophil % (HT): 1 % (ref 0–2)
Eosinophil # (HT): 0.2 10 3/uL (ref 0.0–0.5)
Eosinophil % (HT): 4 % (ref 0–7)
Hematocrit (HT): 32 % — ABNORMAL LOW (ref 40–52)
Hemoglobin (HGB) (HT): 10.2 g/dL — ABNORMAL LOW (ref 11.5–16.0)
Lymphocyte # (HT): 2.3 10 3/uL (ref 0.9–3.8)
Lymphocyte % (HT): 43 % (ref 17–44)
MCHC (HT): 32.4 g/dL (ref 32.0–36.0)
MCV (HT): 87 fL (ref 81–99)
Mean Corpuscular Hemoglobin (MCH) (HT): 28.3 pg (ref 26.0–34.0)
Monocyte # (HT): 0.4 10 3/uL (ref 0.2–1.0)
Monocyte % (HT): 7 % (ref 4–15)
Neutrophil # (HT): 2.5 10 3/uL (ref 1.5–7.7)
Platelets (HT): 318 10 3/uL (ref 140–400)
RBC (HT): 3.61 10 6/uL — ABNORMAL LOW (ref 3.80–5.20)
RDW (HT): 13 % (ref 11.5–15.0)
Seg Neut % (HT): 46 % (ref 43–75)
WBC (HT): 5.4 10 3/uL (ref 4.0–10.0)

## 2021-01-29 ENCOUNTER — Other Ambulatory Visit
Admission: RE | Admit: 2021-01-29 | Discharge: 2021-01-29 | Disposition: A | Discharge status: Home / Self Care | Payer: Self-pay | Source: Ambulatory Visit

## 2021-01-30 LAB — MRSA (ORSA) AMPLIFICATION: MRSA (ORSA) Amplification: 0

## 2021-02-05 LAB — UNMAPPED LAB RESULTS
Basophil # (HT): 0 10 3/uL (ref 0.0–0.2)
Basophil % (HT): 1 % (ref 0–2)
Eosinophil # (HT): 0.6 10 3/uL — ABNORMAL HIGH (ref 0.0–0.5)
Eosinophil % (HT): 8 % — ABNORMAL HIGH (ref 0–7)
Hematocrit (HT): 32 % — ABNORMAL LOW (ref 40–52)
Hemoglobin (HGB) (HT): 10.1 g/dL — ABNORMAL LOW (ref 11.5–16.0)
Lymphocyte # (HT): 2.4 10 3/uL (ref 0.9–3.8)
Lymphocyte % (HT): 32 % (ref 17–44)
MCHC (HT): 31.7 g/dL — ABNORMAL LOW (ref 32.0–36.0)
MCV (HT): 87 fL (ref 81–99)
Mean Corpuscular Hemoglobin (MCH) (HT): 27.5 pg (ref 26.0–34.0)
Monocyte # (HT): 0.6 10 3/uL (ref 0.2–1.0)
Monocyte % (HT): 8 % (ref 4–15)
Neutrophil # (HT): 4 10 3/uL (ref 1.5–7.7)
Platelets (HT): 331 10 3/uL (ref 140–400)
RBC (HT): 3.67 10 6/uL — ABNORMAL LOW (ref 3.80–5.20)
RDW (HT): 13.2 % (ref 11.5–15.0)
Seg Neut % (HT): 52 % (ref 43–75)
WBC (HT): 7.6 10 3/uL (ref 4.0–10.0)

## 2021-02-26 LAB — UNMAPPED LAB RESULTS
Basophil # (HT): 0 10 3/uL (ref 0.0–0.2)
Basophil % (HT): 0 % (ref 0–2)
Eosinophil # (HT): 0.6 10 3/uL — ABNORMAL HIGH (ref 0.0–0.5)
Eosinophil % (HT): 9 % — ABNORMAL HIGH (ref 0–7)
Hematocrit (HT): 32 % — ABNORMAL LOW (ref 40–52)
Hemoglobin (HGB) (HT): 9.9 g/dL — ABNORMAL LOW (ref 11.5–16.0)
Lymphocyte # (HT): 2.7 10 3/uL (ref 0.9–3.8)
Lymphocyte % (HT): 38 % (ref 17–44)
MCHC (HT): 31.1 g/dL — ABNORMAL LOW (ref 32.0–36.0)
MCV (HT): 87 fL (ref 81–99)
Mean Corpuscular Hemoglobin (MCH) (HT): 27.1 pg (ref 26.0–34.0)
Monocyte # (HT): 0.5 10 3/uL (ref 0.2–1.0)
Monocyte % (HT): 7 % (ref 4–15)
Neutrophil # (HT): 3.2 10 3/uL (ref 1.5–7.7)
Platelets (HT): 325 10 3/uL (ref 140–400)
RBC (HT): 3.65 10 6/uL — ABNORMAL LOW (ref 3.80–5.20)
RDW (HT): 13.6 % (ref 11.5–15.0)
Seg Neut % (HT): 45 % (ref 43–75)
WBC (HT): 7.1 10 3/uL (ref 4.0–10.0)

## 2021-03-13 LAB — UNMAPPED LAB RESULTS
Basophil # (HT): 0 10 3/uL (ref 0.0–0.2)
Basophil % (HT): 0 % (ref 0–2)
Eosinophil # (HT): 0 10 3/uL (ref 0.0–0.5)
Eosinophil % (HT): 0 % (ref 0–7)
Hematocrit (HT): 32 % — ABNORMAL LOW (ref 40–52)
Hemoglobin (HGB) (HT): 10.4 g/dL — ABNORMAL LOW (ref 11.5–16.0)
Lymphocyte # (HT): 2.8 10 3/uL (ref 0.9–3.8)
Lymphocyte % (HT): 14 % — ABNORMAL LOW (ref 17–44)
MCHC (HT): 32.6 g/dL (ref 32.0–36.0)
MCV (HT): 84 fL (ref 81–99)
Mean Corpuscular Hemoglobin (MCH) (HT): 27.3 pg (ref 26.0–34.0)
Monocyte # (HT): 1.6 10 3/uL — ABNORMAL HIGH (ref 0.2–1.0)
Monocyte % (HT): 8 % (ref 4–15)
Neutrophil # (HT): 14.9 10 3/uL — ABNORMAL HIGH (ref 1.5–7.7)
Platelets (HT): 240 10 3/uL (ref 140–400)
RBC (HT): 3.81 10 6/uL (ref 3.80–5.20)
RDW (HT): 14.7 % (ref 11.5–15.0)
Seg Neut % (HT): 77 % — ABNORMAL HIGH (ref 43–75)
WBC (HT): 19.5 10 3/uL — ABNORMAL HIGH (ref 4.0–10.0)

## 2021-03-14 LAB — UNMAPPED LAB RESULTS
Hematocrit (HT): 30 % — ABNORMAL LOW (ref 40–52)
Hemoglobin (HGB) (HT): 9.3 g/dL — ABNORMAL LOW (ref 11.5–16.0)
MCHC (HT): 31.2 g/dL — ABNORMAL LOW (ref 32.0–36.0)
MCV (HT): 87 fL (ref 81–99)
Mean Corpuscular Hemoglobin (MCH) (HT): 27.1 pg (ref 26.0–34.0)
Platelets (HT): 221 10 3/uL (ref 140–400)
RBC (HT): 3.43 10 6/uL — ABNORMAL LOW (ref 3.80–5.20)
RDW (HT): 14.8 % (ref 11.5–15.0)
WBC (HT): 17.9 10 3/uL — ABNORMAL HIGH (ref 4.0–10.0)

## 2021-03-15 LAB — UNMAPPED LAB RESULTS
Basophil # (HT): 0 10 3/uL (ref 0.0–0.2)
Basophil % (HT): 0 % (ref 0–2)
Eosinophil # (HT): 0.1 10 3/uL (ref 0.0–0.5)
Eosinophil % (HT): 1 % (ref 0–7)
Hematocrit (HT): 26 % — ABNORMAL LOW (ref 40–52)
Hemoglobin (HGB) (HT): 8 g/dL — ABNORMAL LOW (ref 11.5–16.0)
Lymphocyte # (HT): 1.8 10 3/uL (ref 0.9–3.8)
Lymphocyte % (HT): 19 % (ref 17–44)
MCHC (HT): 31 g/dL — ABNORMAL LOW (ref 32.0–36.0)
MCV (HT): 88 fL (ref 81–99)
Mean Corpuscular Hemoglobin (MCH) (HT): 27.2 pg (ref 26.0–34.0)
Monocyte # (HT): 0.5 10 3/uL (ref 0.2–1.0)
Monocyte % (HT): 6 % (ref 4–15)
Neutrophil # (HT): 6.9 10 3/uL (ref 1.5–7.7)
Platelets (HT): 187 10 3/uL (ref 140–400)
RBC (HT): 2.94 10 6/uL — ABNORMAL LOW (ref 3.80–5.20)
RDW (HT): 14.8 % (ref 11.5–15.0)
Seg Neut % (HT): 74 % (ref 43–75)
WBC (HT): 9.3 10 3/uL (ref 4.0–10.0)

## 2021-03-16 LAB — UNMAPPED LAB RESULTS
Hematocrit (HT): 29 % — ABNORMAL LOW (ref 40–52)
Hemoglobin (HGB) (HT): 9 g/dL — ABNORMAL LOW (ref 11.5–16.0)
MCHC (HT): 31.1 g/dL — ABNORMAL LOW (ref 32.0–36.0)
MCV (HT): 87 fL (ref 81–99)
Mean Corpuscular Hemoglobin (MCH) (HT): 27.2 pg (ref 26.0–34.0)
Platelets (HT): 255 10 3/uL (ref 140–400)
RBC (HT): 3.31 10 6/uL — ABNORMAL LOW (ref 3.80–5.20)
RDW (HT): 14.6 % (ref 11.5–15.0)
WBC (HT): 6.2 10 3/uL (ref 4.0–10.0)

## 2021-03-26 LAB — UNMAPPED LAB RESULTS
Basophil # (HT): 0.1 10 3/uL (ref 0.0–0.2)
Basophil % (HT): 1 % (ref 0–2)
Eosinophil # (HT): 0.3 10 3/uL (ref 0.0–0.5)
Eosinophil % (HT): 3 % (ref 0–7)
Hematocrit (HT): 33 % — ABNORMAL LOW (ref 40–52)
Hemoglobin (HGB) (HT): 10.4 g/dL — ABNORMAL LOW (ref 11.5–16.0)
Lymphocyte # (HT): 3.1 10 3/uL (ref 0.9–3.8)
Lymphocyte % (HT): 39 % (ref 17–44)
MCHC (HT): 31.2 g/dL — ABNORMAL LOW (ref 32.0–36.0)
MCV (HT): 88 fL (ref 81–99)
Mean Corpuscular Hemoglobin (MCH) (HT): 27.5 pg (ref 26.0–34.0)
Monocyte # (HT): 0.4 10 3/uL (ref 0.2–1.0)
Monocyte % (HT): 6 % (ref 4–15)
Neutrophil # (HT): 4 10 3/uL (ref 1.5–7.7)
Platelets (HT): 481 10 3/uL — ABNORMAL HIGH (ref 140–400)
RBC (HT): 3.78 10 6/uL — ABNORMAL LOW (ref 3.80–5.20)
RDW (HT): 14.8 % (ref 11.5–15.0)
Seg Neut % (HT): 51 % (ref 43–75)
WBC (HT): 7.9 10 3/uL (ref 4.0–10.0)

## 2021-04-16 LAB — UNMAPPED LAB RESULTS
Basophil # (HT): 0 10 3/uL (ref 0.0–0.2)
Basophil % (HT): 1 % (ref 0–2)
Eosinophil # (HT): 0.2 10 3/uL (ref 0.0–0.5)
Eosinophil % (HT): 4 % (ref 0–7)
Hematocrit (HT): 32 % — ABNORMAL LOW (ref 40–52)
Hemoglobin (HGB) (HT): 10.3 g/dL — ABNORMAL LOW (ref 11.5–16.0)
Lymphocyte # (HT): 2.7 10 3/uL (ref 0.9–3.8)
Lymphocyte % (HT): 43 % (ref 17–44)
MCHC (HT): 32.1 g/dL (ref 32.0–36.0)
MCV (HT): 86 fL (ref 81–99)
Mean Corpuscular Hemoglobin (MCH) (HT): 27.6 pg (ref 26.0–34.0)
Monocyte # (HT): 0.5 10 3/uL (ref 0.2–1.0)
Monocyte % (HT): 7 % (ref 4–15)
Neutrophil # (HT): 2.9 10 3/uL (ref 1.5–7.7)
Platelets (HT): 310 10 3/uL (ref 140–400)
RBC (HT): 3.73 10 6/uL — ABNORMAL LOW (ref 3.80–5.20)
RDW (HT): 14.6 % (ref 11.5–15.0)
Seg Neut % (HT): 46 % (ref 43–75)
WBC (HT): 6.3 10 3/uL (ref 4.0–10.0)

## 2021-05-07 LAB — UNMAPPED LAB RESULTS
Basophil # (HT): 0 10 3/uL (ref 0.0–0.2)
Basophil % (HT): 0 % (ref 0–2)
Eosinophil # (HT): 0.2 10 3/uL (ref 0.0–0.5)
Eosinophil % (HT): 3 % (ref 0–7)
Hematocrit (HT): 31 % — ABNORMAL LOW (ref 40–52)
Hemoglobin (HGB) (HT): 9.9 g/dL — ABNORMAL LOW (ref 11.5–16.0)
Lymphocyte # (HT): 2.1 10 3/uL (ref 0.9–3.8)
Lymphocyte % (HT): 41 % (ref 17–44)
MCHC (HT): 32.2 g/dL (ref 32.0–36.0)
MCV (HT): 85 fL (ref 81–99)
Mean Corpuscular Hemoglobin (MCH) (HT): 27.3 pg (ref 26.0–34.0)
Monocyte # (HT): 0.5 10 3/uL (ref 0.2–1.0)
Monocyte % (HT): 9 % (ref 4–15)
Neutrophil # (HT): 2.4 10 3/uL (ref 1.5–7.7)
Platelets (HT): 303 10 3/uL (ref 140–400)
RBC (HT): 3.63 10 6/uL — ABNORMAL LOW (ref 3.80–5.20)
RDW (HT): 13.5 % (ref 11.5–15.0)
Seg Neut % (HT): 46 % (ref 43–75)
WBC (HT): 5.1 10 3/uL (ref 4.0–10.0)

## 2021-05-28 LAB — UNMAPPED LAB RESULTS
Basophil # (HT): 0 10 3/uL (ref 0.0–0.2)
Basophil % (HT): 1 % (ref 0–2)
Eosinophil # (HT): 0.2 10 3/uL (ref 0.0–0.5)
Eosinophil % (HT): 3 % (ref 0–7)
Hematocrit (HT): 31 % — ABNORMAL LOW (ref 40–52)
Hemoglobin (HGB) (HT): 10 g/dL — ABNORMAL LOW (ref 11.5–16.0)
Lymphocyte # (HT): 2.4 10 3/uL (ref 0.9–3.8)
Lymphocyte % (HT): 39 % (ref 17–44)
MCHC (HT): 31.9 g/dL — ABNORMAL LOW (ref 32.0–36.0)
MCV (HT): 85 fL (ref 81–99)
Mean Corpuscular Hemoglobin (MCH) (HT): 27.1 pg (ref 26.0–34.0)
Monocyte # (HT): 0.5 10 3/uL (ref 0.2–1.0)
Monocyte % (HT): 8 % (ref 4–15)
Neutrophil # (HT): 3 10 3/uL (ref 1.5–7.7)
Platelets (HT): 303 10 3/uL (ref 140–400)
RBC (HT): 3.69 10 6/uL — ABNORMAL LOW (ref 3.80–5.20)
RDW (HT): 13.2 % (ref 11.5–15.0)
Seg Neut % (HT): 48 % (ref 43–75)
WBC (HT): 6.2 10 3/uL (ref 4.0–10.0)

## 2021-06-18 LAB — UNMAPPED LAB RESULTS
Basophil # (HT): 0 10 3/uL (ref 0.0–0.2)
Basophil % (HT): 1 % (ref 0–2)
Eosinophil # (HT): 0.2 10 3/uL (ref 0.0–0.5)
Eosinophil % (HT): 4 % (ref 0–7)
Hematocrit (HT): 34 % — ABNORMAL LOW (ref 40–52)
Hemoglobin (HGB) (HT): 10.5 g/dL — ABNORMAL LOW (ref 11.5–16.0)
Lymphocyte # (HT): 2.7 10 3/uL (ref 0.9–3.8)
Lymphocyte % (HT): 40 % (ref 17–44)
MCHC (HT): 31.3 g/dL — ABNORMAL LOW (ref 32.0–36.0)
MCV (HT): 85 fL (ref 81–99)
Mean Corpuscular Hemoglobin (MCH) (HT): 26.6 pg (ref 26.0–34.0)
Monocyte # (HT): 0.5 10 3/uL (ref 0.2–1.0)
Monocyte % (HT): 7 % (ref 4–15)
Neutrophil # (HT): 3.3 10 3/uL (ref 1.5–7.7)
Platelets (HT): 327 10 3/uL (ref 140–400)
RBC (HT): 3.94 10 6/uL (ref 3.80–5.20)
RDW (HT): 13.2 % (ref 11.5–15.0)
Seg Neut % (HT): 49 % (ref 43–75)
WBC (HT): 6.7 10 3/uL (ref 4.0–10.0)

## 2021-07-09 LAB — UNMAPPED LAB RESULTS
Basophil # (HT): 0 10 3/uL (ref 0.0–0.2)
Basophil % (HT): 1 % (ref 0–2)
Eosinophil # (HT): 0.2 10 3/uL (ref 0.0–0.5)
Eosinophil % (HT): 3 % (ref 0–7)
Hematocrit (HT): 35 % — ABNORMAL LOW (ref 40–52)
Hemoglobin (HGB) (HT): 10.8 g/dL — ABNORMAL LOW (ref 11.5–16.0)
Lymphocyte # (HT): 2.5 10 3/uL (ref 0.9–3.8)
Lymphocyte % (HT): 41 % (ref 17–44)
MCHC (HT): 31.3 g/dL — ABNORMAL LOW (ref 32.0–36.0)
MCV (HT): 86 fL (ref 81–99)
Mean Corpuscular Hemoglobin (MCH) (HT): 26.8 pg (ref 26.0–34.0)
Monocyte # (HT): 0.5 10 3/uL (ref 0.2–1.0)
Monocyte % (HT): 8 % (ref 4–15)
Neutrophil # (HT): 2.9 10 3/uL (ref 1.5–7.7)
Platelets (HT): 315 10 3/uL (ref 140–400)
RBC (HT): 4.03 10 6/uL (ref 3.80–5.20)
RDW (HT): 13.2 % (ref 11.5–15.0)
Seg Neut % (HT): 47 % (ref 43–75)
WBC (HT): 6.1 10 3/uL (ref 4.0–10.0)

## 2021-07-30 LAB — UNMAPPED LAB RESULTS
Basophil # (HT): 0 10 3/uL (ref 0.0–0.2)
Basophil % (HT): 1 % (ref 0–2)
Eosinophil # (HT): 0.2 10 3/uL (ref 0.0–0.5)
Eosinophil % (HT): 3 % (ref 0–7)
Hematocrit (HT): 32 % — ABNORMAL LOW (ref 40–52)
Hemoglobin (HGB) (HT): 10.3 g/dL — ABNORMAL LOW (ref 11.5–16.0)
Lymphocyte # (HT): 2.3 10 3/uL (ref 0.9–3.8)
Lymphocyte % (HT): 41 % (ref 17–44)
MCHC (HT): 31.8 g/dL — ABNORMAL LOW (ref 32.0–36.0)
MCV (HT): 84 fL (ref 81–99)
Mean Corpuscular Hemoglobin (MCH) (HT): 26.7 pg (ref 26.0–34.0)
Monocyte # (HT): 0.5 10 3/uL (ref 0.2–1.0)
Monocyte % (HT): 8 % (ref 4–15)
Neutrophil # (HT): 2.6 10 3/uL (ref 1.5–7.7)
Platelets (HT): 305 10 3/uL (ref 140–400)
RBC (HT): 3.86 10 6/uL (ref 3.80–5.20)
RDW (HT): 13.3 % (ref 11.5–15.0)
Seg Neut % (HT): 47 % (ref 43–75)
WBC (HT): 5.6 10 3/uL (ref 4.0–10.0)

## 2021-08-08 LAB — UNMAPPED LAB RESULTS
Basophil # (HT): 0 10 3/uL (ref 0.0–0.2)
Basophil % (HT): 0 % (ref 0–2)
Eosinophil # (HT): 0.1 10 3/uL (ref 0.0–0.5)
Eosinophil % (HT): 0 % (ref 0–7)
Hematocrit (HT): 32 % — ABNORMAL LOW (ref 40–52)
Hemoglobin (HGB) (HT): 10.2 g/dL — ABNORMAL LOW (ref 11.5–16.0)
Lymphocyte # (HT): 2.4 10 3/uL (ref 0.9–3.8)
Lymphocyte % (HT): 18 % (ref 17–44)
MCHC (HT): 31.7 g/dL — ABNORMAL LOW (ref 32.0–36.0)
MCV (HT): 83 fL (ref 81–99)
Mean Corpuscular Hemoglobin (MCH) (HT): 26.4 pg (ref 26.0–34.0)
Monocyte # (HT): 1.2 10 3/uL — ABNORMAL HIGH (ref 0.2–1.0)
Monocyte % (HT): 9 % (ref 4–15)
Neutrophil # (HT): 9.9 10 3/uL — ABNORMAL HIGH (ref 1.5–7.7)
Platelets (HT): 299 10 3/uL (ref 140–400)
RBC (HT): 3.86 10 6/uL (ref 3.80–5.20)
RDW (HT): 13.8 % (ref 11.5–15.0)
Seg Neut % (HT): 73 % (ref 43–75)
WBC (HT): 13.6 10 3/uL — ABNORMAL HIGH (ref 4.0–10.0)

## 2021-08-09 LAB — UNMAPPED LAB RESULTS
Basophil # (HT): 0 10 3/uL (ref 0.0–0.2)
Basophil % (HT): 0 % (ref 0–2)
Eosinophil # (HT): 0.1 10 3/uL (ref 0.0–0.5)
Eosinophil % (HT): 1 % (ref 0–7)
Hematocrit (HT): 29 % — ABNORMAL LOW (ref 40–52)
Hemoglobin (HGB) (HT): 8.9 g/dL — ABNORMAL LOW (ref 11.5–16.0)
Lymphocyte # (HT): 2.1 10 3/uL (ref 0.9–3.8)
Lymphocyte % (HT): 19 % (ref 17–44)
MCHC (HT): 30.4 g/dL — ABNORMAL LOW (ref 32.0–36.0)
MCV (HT): 86 fL (ref 81–99)
Mean Corpuscular Hemoglobin (MCH) (HT): 26.1 pg (ref 26.0–34.0)
Monocyte # (HT): 0.7 10 3/uL (ref 0.2–1.0)
Monocyte % (HT): 7 % (ref 4–15)
Neutrophil # (HT): 8.2 10 3/uL — ABNORMAL HIGH (ref 1.5–7.7)
Platelets (HT): 235 10 3/uL (ref 140–400)
RBC (HT): 3.41 10 6/uL — ABNORMAL LOW (ref 3.80–5.20)
RDW (HT): 13.7 % (ref 11.5–15.0)
Seg Neut % (HT): 74 % (ref 43–75)
WBC (HT): 11.2 10 3/uL — ABNORMAL HIGH (ref 4.0–10.0)

## 2021-08-10 LAB — UNMAPPED LAB RESULTS
Basophil # (HT): 0 10 3/uL (ref 0.0–0.2)
Basophil % (HT): 0 % (ref 0–2)
Eosinophil # (HT): 0.2 10 3/uL (ref 0.0–0.5)
Eosinophil % (HT): 2 % (ref 0–7)
Hematocrit (HT): 31 % — ABNORMAL LOW (ref 40–52)
Hemoglobin (HGB) (HT): 10 g/dL — ABNORMAL LOW (ref 11.5–16.0)
Lymphocyte # (HT): 2 10 3/uL (ref 0.9–3.8)
Lymphocyte % (HT): 19 % (ref 17–44)
MCHC (HT): 32.5 g/dL (ref 32.0–36.0)
MCV (HT): 82 fL (ref 81–99)
Mean Corpuscular Hemoglobin (MCH) (HT): 26.5 pg (ref 26.0–34.0)
Monocyte # (HT): 0.4 10 3/uL (ref 0.2–1.0)
Monocyte % (HT): 4 % (ref 4–15)
Neutrophil # (HT): 7.9 10 3/uL — ABNORMAL HIGH (ref 1.5–7.7)
Platelets (HT): 305 10 3/uL (ref 140–400)
RBC (HT): 3.77 10 6/uL — ABNORMAL LOW (ref 3.80–5.20)
RDW (HT): 13.6 % (ref 11.5–15.0)
Seg Neut % (HT): 74 % (ref 43–75)
WBC (HT): 10.6 10 3/uL — ABNORMAL HIGH (ref 4.0–10.0)

## 2021-08-11 LAB — UNMAPPED LAB RESULTS
Basophil # (HT): 0 10 3/uL (ref 0.0–0.2)
Basophil % (HT): 0 % (ref 0–2)
Eosinophil # (HT): 0.3 10 3/uL (ref 0.0–0.5)
Eosinophil % (HT): 3 % (ref 0–7)
Hematocrit (HT): 30 % — ABNORMAL LOW (ref 40–52)
Hemoglobin (HGB) (HT): 9.4 g/dL — ABNORMAL LOW (ref 11.5–16.0)
Lymphocyte # (HT): 1.9 10 3/uL (ref 0.9–3.8)
Lymphocyte % (HT): 25 % (ref 17–44)
MCHC (HT): 31.4 g/dL — ABNORMAL LOW (ref 32.0–36.0)
MCV (HT): 83 fL (ref 81–99)
Mean Corpuscular Hemoglobin (MCH) (HT): 26.1 pg (ref 26.0–34.0)
Monocyte # (HT): 0.5 10 3/uL (ref 0.2–1.0)
Monocyte % (HT): 7 % (ref 4–15)
Neutrophil # (HT): 4.7 10 3/uL (ref 1.5–7.7)
Platelets (HT): 312 10 3/uL (ref 140–400)
RBC (HT): 3.6 10 6/uL — ABNORMAL LOW (ref 3.80–5.20)
RDW (HT): 13.5 % (ref 11.5–15.0)
Seg Neut % (HT): 64 % (ref 43–75)
WBC (HT): 7.3 10 3/uL (ref 4.0–10.0)

## 2021-08-30 ENCOUNTER — Other Ambulatory Visit
Admission: RE | Admit: 2021-08-30 | Discharge: 2021-08-30 | Disposition: A | Discharge status: Home / Self Care | Payer: PRIVATE HEALTH INSURANCE | Source: Ambulatory Visit

## 2021-08-30 LAB — MRSA NAAT (PCR): MRSA NAAT (PCR): NEGATIVE

## 2021-08-31 NOTE — H&P (Signed)
H&P note  ?  Era Bumpers, MD  Physician  Plastic Surgery  Progress Notes ?   Signed  Encounter Date:  08/27/2021                                   Patient seen today in the office in follow-up.  Unfortunately since I saw her Wednesday the open area on her breast has opened up even further and there is now an exposed area of the implant that is approximately quarter sized round.  Therefore I explained to the patient that the implant would need to be removed and we could do that because it is a saline implant in the office today.  There is no signs of infection in her and her breast is not red erythematous not warm to touch she is not running any fevers there is no purulent drainage coming from around the implant.  Therefore I did numb up some of the incision opened it up another inch or so placed a hole in the saline implant that was exposed and pulled out through the opening.  She tolerated this very well without any pain or discomfort.  There is no drainage around the implant in the pocket after removal.  I did send a culture already on Wednesday which so far it has not grown anything.  Does not does not appear to be an infected implant therefore I packed the wound with saline soaked gauze Kerlix.  We will keep an ABD pad over top of that and I will see her back on Monday to change the packing.  Plan at this point will be to put an expander in her next week at some point.  My feeling is that the implant was just too large for her chest wall and that we should use an expander and just gradually stretch the skin out.  Patient is in agreement with the above-noted plan.  I will talk to my partner Dr. Thresa Ross to see if he would do anything differently at this point and I will give Marge a call over the weekend once I talk to Dr. Thresa Ross.      Electronically signed by Era Bumpers, MD at 08/27/2021 ?4:16 PM  Office Visit on 08/27/2021    Office Visit on 08/27/2021 <redacted file path>      Epic Note  Version <redacted file path>    Note viewed by patient

## 2021-08-31 NOTE — Unmapped External Note (Signed)
Surgery Brief Op Note    Patient Information:  [name]     MRN: [id number]  56 y.o. Female    DOB: [birth year]      Procedure(s):  PLACEMENT OF RIGHT BREAST EXPANDER AND ALLODERM    Pre-op Diagnosis: HISTORY OF RIGHT BREAST CANCER - Z85.3    Post-op Diagnosis: history of right breast cancer    Findings: clean pocket, cultures sent    Complications: no    Surgeon: Surgeon(s) and Role:     * Era Bumpers, MD - Primary    Assistants: Elba Barman PA    Anesthesia:  Type: General  Anesthesiologist: Anesthesiologist: Neomia Glass, MD    Estimated Blood Loss: 10 mL    Fluids: LR 900 mL    UOP: N/A           Drains: (1) Jackson-Pratt drain(s) with closed bulb suction in the right chest wall    Condition: stable              Specimens Removed:  Cultures sent    Wound closed with: Sutures      Electronically signed by Legrand Rams, PA

## 2021-08-31 NOTE — Unmapped External Note (Signed)
Post-op/ Operative Report    Patient Identification  Name:[name]  DOB:  1965/07/29  [id number]  [id number]   08/31/21    Surgeon: Era Bumpers, MD    Assistant:.Julia Lucrezia Starch    Anesthesia:General     Anesthetist: Anesthesiologist: Neomia Glass, MD,    Preoperative Diagnosis:HISTORY OF RIGHT BREAST CANCER - Z85.3     Postoperative Diagnosis: PLACEMENT OF RIGHT BREAST EXPANDER    Operative Procedure: Procedure(s):  PLACEMENT OF RIGHT BREAST EXPANDER     Location Modifier:Right    Complications: None    Estimated Blood Loss: 10 cc    Implants:  Implant Name Type Inv. Item Serial No. Manufacturer Lot No. LRB No. Used Action   EXPANDER TISSUE SMOOTH 600 CC Implant EXPANDER TISSUE SMOOTH 600 CC [id number] [facility]  Right 1 Implanted        Specimen: * No specimens in log *     Procedure Description      We have discussed the potential risks of the procedure completely.  The risks we discussed included, but were not limited to bleeding, infection, blood clot formation, pulmonary embolism, fracture, instability, and complex regional pain syndrome.  We also discussed there are risks associated with these procedures that we have failed to discuss, but are still possible and the patient understood this and wished for [place] to proceed with surgery as planned.    It should be noted that Marcie Bal PA-C was used throughout the entirety of the operation for prepping and draping, positioning, retraction, exposure and closing.  She was an integral and necessary part of the surgical team.    Patient was notified and transported operating room suite.  Patient is placed the operating table underwent general anesthesia via LMA.  Patient's right wrist region was sterilely prepped and draped in normal fashion.  Final timeout was performed all were in agreement.  Operation was started by first washing out the patient's previous pocket.  The incision was then opened up inferiorly for about another inch to make the  pocket large enough to place a new expander in.  After irrigating well I then washed the pocket also gently scraped the pocket with a lap sponge around the entire pocket to remove any biofilm that could have formed in that region.  I did also take a swab culture to check for any contaminant.  I opened up the pocket superiorly and laterally to enable the expander to be placed.  I used a 600 cc moderate profile Allergan expander took all the air out of the expander injected up to 100 cc of saline and placed it into the pocket and sutured into place using 2-0 nylon suture through the tabs that were on the expander.  I then used electrocautery to make a flap of tissue over the expander and sutured that together using 2-0 Vicryl suture.  I then closed the skin over top of the first layer using four 3-0 Monocryl deep buried dermal sutures.  Should be noted prior to placing expander I did place a #15 channel drain in the pocket.  The drain was sutured in place using a 2-0 silk suture.  The skin of the incision was closed using 4-0 nylon running simple suture.  Drain was placed to bulb suction and functioned well.  Also check with the magnet to make sure the expander was in a good position which of which was.  Dressing consisted of Xeroform ABD pads she was placed into a surgical bra patient  was extubated and transferred to recovery room in stable condition.  Should also be noted that at the end of the procedure I did place 20 cc of quarter percent Marcaine with epinephrine in the subcutaneous plane.    NOTE: This note was completed using a voice recognition software.  Every reasonable attempt was made to edit and correct the dictation.  Some incorrect words may remain in the text.

## 2021-11-01 LAB — UNMAPPED LAB RESULTS
Basophil # (HT): 0 10 3/uL (ref 0.0–0.2)
Basophil % (HT): 1 % (ref 0–2)
Eosinophil # (HT): 0.3 10 3/uL (ref 0.0–0.5)
Eosinophil % (HT): 4 % (ref 0–7)
Hematocrit (HT): 35 % — ABNORMAL LOW (ref 40–52)
Hemoglobin (HGB) (HT): 10.7 g/dL — ABNORMAL LOW (ref 11.5–16.0)
Lymphocyte # (HT): 3 10 3/uL (ref 0.9–3.8)
Lymphocyte % (HT): 46 % — ABNORMAL HIGH (ref 17–44)
MCHC (HT): 30.2 g/dL — ABNORMAL LOW (ref 32.0–36.0)
MCV (HT): 83 fL (ref 81–99)
Mean Corpuscular Hemoglobin (MCH) (HT): 25.1 pg — ABNORMAL LOW (ref 26.0–34.0)
Monocyte # (HT): 0.6 10 3/uL (ref 0.2–1.0)
Monocyte % (HT): 10 % (ref 4–15)
Neutrophil # (HT): 2.6 10 3/uL (ref 1.5–7.7)
Platelets (HT): 336 10 3/uL (ref 140–400)
RBC (HT): 4.26 10 6/uL (ref 3.80–5.20)
RDW (HT): 14 % (ref 11.5–15.0)
Seg Neut % (HT): 40 % — ABNORMAL LOW (ref 43–75)
WBC (HT): 6.5 10 3/uL (ref 4.0–10.0)

## 2021-12-01 ENCOUNTER — Encounter: Payer: Self-pay | Admitting: Gastroenterology

## 2022-01-04 NOTE — Progress Notes (Signed)
Patient seen today in the office in follow-up for the removal of the right breast implant.  The wound has now completely healed she has had no drainage since I last saw her.  Last time I saw her use a little silver nitrate on a small spot of hypergranulation tissue.  But at this point everything looks good.  Her plan is for bilateral DIEP flap reconstruction by Dr. Cena Benton December 21.  At this point I will see her back on an as-needed basis.

## 2022-01-06 NOTE — Telephone Encounter (Signed)
Last Visit: 10/25/2021   Upcoming Visit: Visit date not found       Front:    Lab Results   Component Value Date    HBA1C 6.6 (H) 08/30/2021    LDLC 100 (H) 03/26/2021    TSH 0.86 09/18/2020    GLU 132 (H) 08/11/2021    CA 9.1 08/11/2021    NA 142 08/11/2021    K 3.6 08/11/2021    CL 104 08/11/2021    BUN 11 08/11/2021    CREAT 0.7 08/11/2021    AST 16 08/11/2021       BP Readings from Last 1 Encounters:   01/04/22 (!) 144/91

## 2022-04-12 ENCOUNTER — Encounter: Payer: Self-pay | Admitting: Gastroenterology

## 2022-04-12 NOTE — Telephone Encounter (Signed)
Me     SB   04/11/22 3:04 PM  Note  Pt called the office based off 04/11/22 [facility] message and voicemail she received today. Pt has more questions for a nurse in regarding the urgency of MRI. Pt has surgery on 05/05/22 and would like to know if she can wait till next year 2024 to have this done or if urgent prior to surgery?

## 2022-04-18 NOTE — Progress Notes (Signed)
Subjective      NAME: [name] MRN: [id number]    DOB: [birth year] AGE: 56 y.o. SEX: female     Chief Complaint: Chief Complaint   Patient presents with   . Hypertension        Visit History for  04/18/2022    Data   History of Present Illness    Patient presents for follow up/med check.  Hx of HTN - well controlled on amlodipine, lisinopril, metoprolol  Hx of Anxiety - effecor 3 tabs daily, Xanax prn  Has a lot of stress right now due to upcoming surgery and her brother was just put in to hospice for pancreatic CA   She has been using more xanax than usual, but only a few times per week  Does not feel that effexor is working as well for her    Diagnosed with CA 12/21. Underwent mastectomy 05/23/20 for stage 1 breast CA. On Letrozole daily.  Has had multiple implant/expander failures on the right side.    Recent Adrenal nodule seen on CT scan.  MRI ordered.  Having another surgery 05/05/22 - doing a deep flap - using abdominal fat to make 2 new breasts.    She has hx of Gout  She had flare last week, used indomethacin - first flare up this year   Had 2 last year   Med did not work well for her     Lab Results   Component Value Date    HBA1C 6.6 (H) 08/30/2021    LDLC 100 (H) 03/26/2021    TSH 0.86 09/18/2020    GLU 132 (H) 08/11/2021    CA 9.1 08/11/2021    NA 142 08/11/2021    K 3.6 08/11/2021    BUN 11 08/11/2021    CREAT 0.7 08/11/2021    GFR1 >60 06/15/2020    GFRB1 >60 06/15/2020    AST 16 08/11/2021    VD25 16 (L) 09/18/2020       Lab Results   Component Value Date    WBCR 6.5 11/01/2021    HCT 35 (L) 11/01/2021    PLTR 336 11/01/2021        BP Readings from Last 3 Encounters:   04/18/22 134/76   01/04/22 (!) 144/91   12/27/21 (!) 153/98       Body mass index is 34.61 kg/m.    Wt Readings from Last 3 Encounters:   04/18/22 208 lb (94.3 kg)   01/04/22 223 lb (101.2 kg)   12/27/21 223 lb (101.2 kg)      Review of Systems   Constitutional: Negative.    Eyes: Negative for photophobia and visual disturbance.    Respiratory: Negative.    Cardiovascular: Negative.    Skin: Negative.    Neurological: Negative.    Psychiatric/Behavioral: Positive for dysphoric mood. Negative for agitation, decreased concentration, hallucinations, self-injury, sleep disturbance and suicidal ideas. The patient is nervous/anxious.               Tobacco  History:  Patient  reports that she has never smoked. She has been exposed to tobacco smoke. She has never used smokeless tobacco.               Medications and Allergies   Outpatient Medications Marked as Taking for the 04/18/22 encounter (Office Visit) with Cordelia Pen, NP   Medication Sig Dispense Refill   . acetaminophen 325 MG Oral tablet Take 2 tablets by mouth every 6 (six) hours as needed.  Do not exceed 4000 mg of Acetaminophen in 24 hours     . albuterol 90 mcg/actuation Inhl inhaler Inhale 2 puffs into the lungs every 4 (four) hours if needed for Wheezing. 18 g 11   . AMLODIPINE 5 MG Oral tablet TAKE 1 TABLET BY MOUTH AT  BEDTIME 90 tablet 3   . butalbital-acetaminophen-caffeine 50-325-40 mg Oral per tablet Take 1-2 tablets by mouth every 6 (six) hours as needed for Headaches. MDD 6 40 tablet 0   . cholecalciferol, vitamin D3, 50 mcg (2,000 unit) Tab Take 1 tablet by mouth daily.       . ferrous sulfate 325 (65 FE) MG Oral tablet Take 1 tablet by mouth daily with breakfast. 90 tablet 3   . fluticasone propionate 50 mcg/actuation Nasl nasal spray 1 spray by Nasal route daily as needed for Rhinitis.     Marland Kitchen ibuprofen/diphenhydramine cit (ADVIL PM ORAL) Take 1 tablet by mouth at bedtime as needed.     Marland Kitchen letrozole 2.5 mg Oral tablet Take 1 tablet by mouth daily. 90 tablet 3   . LISINOPRIL 40 MG Oral tablet TAKE 1 TABLET BY MOUTH DAILY 90 tablet 3   . loperamide 2 mg Oral capsule Take 1 capsule by mouth 4 (four) times daily as needed for Diarrhea.     . multivitamin with minerals (HAIR,SKIN AND NAILS ORAL) Take  by mouth daily.     Marland Kitchen omeprazole 20 MG Oral capsule Take 1 capsule by mouth  daily.     . rizatriptan 10 MG Oral tablet Take 1 tablet by mouth as needed for Migraine. 10 tablet 5   . [DISCONTINUED] ALPRAZolam 0.5 MG Oral tablet TAKE 1 TABLET BY MOUTH 3 TIMES A DAY AS NEEDED FOR ANXIETY. MAX DAILY DOSE: 3 TABLETS. 30 tablet 0   . [DISCONTINUED] METOPROLOL SUCCINATE 50 MG Oral 24 hr EXT REL tab TAKE 1 TABLET BY MOUTH DAILY 90 tablet 0   . [DISCONTINUED] venlafaxine 25 MG Oral tablet TAKE 2 TABLETS BY MOUTH IN THE  MORNING AND 1 TABLET BY MOUTH AT BEDTIME 270 tablet 3     Allergies   Allergen Reactions   . Penicillins Unknown     From allergy testing   . Tegaderm Ag Mesh [Silver] Other (See Comments)     Blistering at site       I have reviewed the above medications and allergies      Objective:     Today's vitals:  weight is 208 lb (94.3 kg). Her blood pressure is 134/76 and her pulse is 87. Her oxygen saturation is 98%.      Physical Exam  Constitutional:       General: She is not in acute distress.     Appearance: Normal appearance.   Cardiovascular:      Rate and Rhythm: Normal rate and regular rhythm.      Heart sounds: Normal heart sounds.   Pulmonary:      Effort: Pulmonary effort is normal. No respiratory distress.      Breath sounds: Normal breath sounds.   Musculoskeletal:         General: No swelling. Normal range of motion.   Neurological:      Mental Status: She is alert.   Psychiatric:         Attention and Perception: Attention normal.         Mood and Affect: Mood is anxious. Affect is tearful.         Speech: Speech  normal.         Behavior: Behavior normal.         Thought Content: Thought content normal.         Judgment: Judgment normal.             Assessment/Plan       1. Essential hypertension  Stable BP  Continue current medicines   Continue low sodium diet   - metoprolol SUCCINATE 50 MG Oral 24 hr EXT REL tab; TAKE 1 TABLET BY MOUTH DAILY  Dispense: 90 tablet; Refill: 1    2. Gastroesophageal reflux disease with esophagitis without hemorrhage  Stable on current medicine      3. Anxiety  Adjust medicines  Stop IR dosing TID and take ER dose once daily 75 mg  In 1 week increase to 150 mg daily   Continue xanax prn - use is appropriate and may increase a bit over the next month or so  She will reach out in 6-8 weeks to let me know how she is doing  Sooner PRN   - venlafaxine 75 MG Oral 24 hr EXT REL cap; Take 1 capsule daily for 1 week then increase to 2 capsules daily.  Dispense: 60 capsule; Refill: 1    4. History of left breast cancer  She has reconstruction surgery in 3 weeks   She is aware of incidental adrenal nodule seen on CT and will have MRI after this  - ALPRAZolam 0.5 MG Oral tablet; TAKE 1 TABLET BY MOUTH 3 TIMES A DAY AS NEEDED FOR ANXIETY. MAX DAILY DOSE: 3 TABLETS.  Dispense: 30 tablet; Refill: 0    5. Acute gout involving toe of left foot, unspecified cause  Gout attack has resolved but given colchicine for next time  If > 3 per year consider allopurinol  New medication education done, reviewed when to take, how to take, and side effects. Patient verbalized understanding.  She is aware of triggers to avoid  - colchicine 0.6 mg Oral tablet; Take 2 tabs at first sign of Gout flare. Take 1 tab 1 hour later. Then take 1 tab BID until flare resolves.  Dispense: 20 tablet; Refill: 0                         Follow up  recommended: Return in about 6 months (around 10/18/2022).     Patient verbalized understanding of the above instructions.     Cordelia Pen, NP  Electronically signed on 04/18/2022 at 8:07 AM.

## 2022-04-29 LAB — UNMAPPED LAB RESULTS
Basophil # (HT): 0 10 3/uL (ref 0.0–0.2)
Basophil % (HT): 1 % (ref 0–2)
Eosinophil # (HT): 0.2 10 3/uL (ref 0.0–0.5)
Eosinophil % (HT): 4 % (ref 0–7)
Hematocrit (HT): 41 % (ref 40–52)
Hemoglobin (HGB) (HT): 13.5 g/dL (ref 11.5–16.0)
Lymphocyte # (HT): 2.3 10 3/uL (ref 0.9–3.8)
Lymphocyte % (HT): 35 % (ref 17–44)
MCHC (HT): 33.3 g/dL (ref 32.0–36.0)
MCV (HT): 96 fL (ref 81–99)
Mean Corpuscular Hemoglobin (MCH) (HT): 31.8 pg (ref 26.0–34.0)
Monocyte # (HT): 0.5 10 3/uL (ref 0.2–1.0)
Monocyte % (HT): 7 % (ref 4–15)
Neutrophil # (HT): 3.4 10 3/uL (ref 1.5–7.7)
Platelets (HT): 280 10 3/uL (ref 150–450)
RBC (HT): 4.25 10 6/uL (ref 3.80–5.20)
RDW (HT): 12 % (ref 11.5–15.0)
Seg Neut % (HT): 53 % (ref 43–75)
WBC (HT): 6.4 10 3/uL (ref 4.0–10.0)

## 2022-04-29 NOTE — Progress Notes (Signed)
[address]  [place], [place]. [address]  Phone: [phone]    Fax: [phone]     Medical Oncology Follow Up Note    [name]   [birth year]   [id number]    Diagnosis:    Diagnosis Plan   1. Stage 1 breast cancer, ER+, right  DEXA bone density spine and hip    CBC And Differential    Ferritin    TIBC & Iron      2. Anemia in chronic illness  CBC And Differential    Ferritin    TIBC & Iron          CC: Follow-up on Herceptin and anastrozole          Current treatment: Paclitaxel and herceptin    Prior treatments: bilateral meastectomy    HPI:    Patient presents today.  Completed Herceptin in February of this year.  Tolerated well, has not had any residual effects.  Has some leg aching and joint aches, asking if this was due to paclitaxel.  She has been on anastrozole previously, and the symptoms improved with the switch to letrozole.    Going for additional reconstruction and DIEP flap bilaterally after R breast implant infection.     Mediport previously reviewed.     CT abdomen for upcoming reconstructive surgery.  Had a nonspecific left adrenal nodule 1.6 x 1.3 cm, and her physicians are getting adrenal MRI to rule out any cause for concern.      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     05/11/2020 -  Other    05/11/2020 she underwent MRI demonstrating in the right breast a 10 x 7 x 7 mm enhancing nodularity with subtle spiculation in the 12 o'clock position 4 cm from the nipple.  There was also a 7 mm enhancing nodule with possible spiculations 9 to 10 o'clock position, 8 cm from the nipple.  There was also a 5 mm enhancing nodularity inferior to the lateral quadrant of the right breast from the nipple with possible spiculations.  No lymphadenopathy was seen.  In the left breast there was a 4 mm nodularity in the 3 o'clock position 9 cm from the nipple thought  to possibly be an intramammary lymph node.     05/13/2020 -  Other      05/13/2020 and ultrasound correlated with them, again seen in the 12:00 nodule 1.3 x 0.7 x 0.8 cm irregular mass, the 10:00 nodule 8 cm from the nipple 6 x 5 x 7 mm, and the 5 mm lesion could no longer be seen.     05/14/2020 -  Other    Core biopsy 05/14/2020 found invasive ductal carcinoma grade 2 from the 12:00 lesion, and invasive ductal carcinoma from the 10:00 lesion.     05/29/2020 Initial Diagnosis    Stage 1 breast cancer, ER+, right (CMS HCC Code)     06/23/2020 Surgery    She underwent bilateral mastectomy on 06/23/2020, with 3 areas of carcinoma found in the right breast.  Left breast consistent with fibrocystic changes, usual ductal hyperplasia, no malignancy.  Right breast had a 16mm, grade 3 invasive ductal carcinoma, ER positive, Allred score 7, PR positive, Allred score 6, and HER-2 positive on immunohistochemistry.  The 12 mm area of invasive carcinoma was grade 2, strongly ER  and PR positive, HER-2 negative, and the 4 mm area of invasive carcinoma was grade 1, strongly ER and PR positive, HER-2 negative.     08/10/2020 -  Systemic Therapy    Patient start cycle 1 of paclitaxel and trastuzumab adjuvant therapy     11/17/2020 - 08/02/2021 Systemic Therapy    Patient starts Herceptin therapy alone after completing paclitaxel.      She has started on anastrozole.     02/01/2021 Surgery    Incision and drainage for right breast inflammation.  Cultures only positive for Staph epidermidis.     03/13/2021 Adverse Event/Complication    Admitted to the hospital with syncope, found to have COVID, also Staph aureus infection of breast implant, status post removal.     07/09/2021 -  Systemic Therapy    Switched from anastrozole to letrozole due to muscle aches         ROS:  Pertinent positives and negatives per HPI.     Past Medical History:   Diagnosis Date   . AKI (acute kidney injury) (CMS HCC Code)    . Anxiety    . Asthma    . Breast cancer      Bilateral mastectomy 06/23/2020, currently receiving chemo   . Chemoprevention 12/30/2020    anastrazole    . Cholelithiasis 10/2011    Gallstone seen on CT urography   . COVID-19    . Genetic testing 06/02/2020    [employer]- VUS POLD p.T441M - completed through [facility]- 01/2020   . GERD (gastroesophageal reflux disease)    . H/O mammogram 02/20/2015    Birads cat 1:  neg, Ultrasound negative   . H/O mammogram 03/02/2016    birads-1   . H/O mammogram 03/03/2017    birads-1   . History of vascular access device     IR port placement 08/07/2020 right chest; removed 08/06/2021   . Hypertension    . Migraines    . Morbidly obese (CMS HCC Code)    . Snoring    . Stage 1 breast cancer, ER+, right (CMS HCC Code) 05/29/2020   . TMJ disease     clenches teeth       Past Surgical History:   Procedure Laterality Date   . APPENDECTOMY  1988   . BREAST ENHANCEMENT SURGERY Right 02/01/2021    Dr. Tonny Branch [facility]; right breast re-implantation   . BREAST ENHANCEMENT SURGERY Right 08/06/2021   . BREAST IMPLANT REMOVAL Right 03/16/2021    with placement VAC dressing   . BREAST RECONSTRUCTION Bilateral 11/27/2020    implants Dr Tonny Branch    . BREAST SURGERY Bilateral 06/23/2020    right mastectomy, sentinel node dissection, left mastectomy, recon with [facility]   . COLONOSCOPY N/A 08/07/2017    Procedure: COLONOSCOPY;;  Surgeon: Martina Sinner, MD;  Location: [facility];  Laterality: N/A   . IR PORT PLACEMENT EQUAL OR > 5 YEARS  08/04/2020    Removed 08/06/2021   . LUMBAR FUSION      11/2012, Dr. Donell Beers   . POSTERIOR FUSION LUMBAR SPINE  07/06/2016    Dr. Redgie Grayer.  Extension of fusion   . WISDOM TOOTH EXTRACTION      as a teenager        Family history discussed.She has a family history with her mother having breast cancer at age 64, and then metastasized to liver and bone shortly thereafter. Her father had pancreatic cancer. There may have been prostate cancer on  his side of the family, and a paternal cousin also had stage IV  breast, and another paternal cousin, 52 years old also had breast cancer, still living. The remainder of her maternal side is not well known. 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    Social history discussed.Accompanied by her husband Nadine Counts, and has 1 son who is 29 years old and currently at [employer]. 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 She does not have a history of smoking. She may have a glass of wine per week.    Obstetric and gynecologic history:  -G1 P1  -First child at age 73  -No history of fertility treatments  -OCPs for years until 23, then IUD for 10 years  -Had 4 days of bleeding in October after spotty menses with IUD, has not had menstrual cycle since. Was told that she had a polyp in the uterus and was going to undergo removal before her diagnosis of cancer.  -She has hot flashes, not every day, usually at night when they do occur.           Current Outpatient Medications on File Prior to Visit   Medication Sig Dispense Refill   . acetaminophen 325 MG Oral tablet Take 2 tablets by mouth every 6 (six) hours as needed. Do not exceed 4000 mg of Acetaminophen in 24 hours     . albuterol 90 mcg/actuation Inhl inhaler Inhale 2 puffs into the lungs every 4 (four) hours if needed for Wheezing. 18 g 11   . ALPRAZolam 0.5 MG Oral tablet TAKE 1 TABLET BY MOUTH 3 TIMES A DAY AS NEEDED FOR ANXIETY. MAX DAILY DOSE: 3 TABLETS. 30 tablet 0   . AMLODIPINE 5 MG Oral tablet TAKE 1 TABLET BY MOUTH AT  BEDTIME 90 tablet 3   . butalbital-acetaminophen-caffeine 50-325-40 mg Oral per tablet Take 1-2 tablets by mouth every 6 (six) hours as needed for Headaches. MDD 6 40 tablet 0   . cholecalciferol, vitamin D3, 50 mcg (2,000 unit) Tab Take 1 tablet by mouth daily.       . colchicine 0.6 mg Oral tablet Take 2 tabs at first sign of Gout flare. Take 1 tab 1 hour later. Then take 1 tab BID until flare resolves. 20 tablet 0   . diazePAM 2 MG  Oral tablet Take 1 or 2 tabs 30 minutes prior to MRI  MDD # 2 tabs 2 tablet 0   . ferrous sulfate 325 (65 FE) MG Oral tablet Take 1 tablet by mouth daily with breakfast. 90 tablet 3   . fluticasone propionate 50 mcg/actuation Nasl nasal spray 1 spray by Nasal route daily as needed for Rhinitis.     Marland Kitchen ibuprofen/diphenhydramine cit (ADVIL PM ORAL) Take 1 tablet by mouth at bedtime as needed.     Marland Kitchen letrozole 2.5 mg Oral tablet Take 1 tablet by mouth daily. 90 tablet 3   . LISINOPRIL 40 MG Oral tablet TAKE 1 TABLET BY MOUTH DAILY 90 tablet 3   . loperamide 2 mg Oral capsule Take 1 capsule by mouth 4 (four) times daily as needed for Diarrhea.     . metoprolol SUCCINATE 50 MG Oral 24 hr EXT REL tab TAKE 1 TABLET BY MOUTH DAILY 90 tablet 1   . multivitamin with minerals (HAIR,SKIN AND NAILS ORAL) Take  by mouth daily.     Marland Kitchen omeprazole 20 MG Oral capsule Take 1 capsule by mouth daily.     Marland Kitchen  oxyCODONE-acetaminophen 5-325 mg Oral per tablet TAKE 1 TO 2 TABLETS BY MOUTH EVERY 4-6 HOURS AS NEEDED FOR PAIN. MAX DAILY DOSE: 6 TABLETS     . rizatriptan 10 MG Oral tablet Take 1 tablet by mouth as needed for Migraine. 10 tablet 5   . venlafaxine 75 MG Oral 24 hr EXT REL cap Take 1 capsule daily for 1 week then increase to 2 capsules daily. 60 capsule 1     No current facility-administered medications on file prior to visit.          Physical Exam:    Vitals:    04/29/22 1010   BP: 118/69   Pulse: 72   Temp: 35.6 C (96.1 F)       ECOG 0  General: AAOx3, NAD  HEENT:  nonicteric sclera, no mucosal erythema or exudates  Neck: no palpable cervical adenopathy  Breasts: No palpable masses surrounding left implant.  No nodules.  Loose skin surrounding area where her right implant had been removed previously, with scar tissue present, no erythema, no drainage-unchanged from prior.  Mediport is out.  No axillary lymphadenopathy  Resp: CTAB, no wheezes, rales, or rhonchi.  Breathing comfortably on room air   Cardio: Regular rate and  rhythm  Extremities: No edema in lower extremities          Results/Data <redacted file path>: I reviewed the following labs today   Hematology:  Lab Results   Component Value Date    WBCR 6.5 11/01/2021    HGB 10.7 (L) 11/01/2021    HCT 35 (L) 11/01/2021    MCV 83 11/01/2021    PLTR 336 11/01/2021     Lab Results   Component Value Date    NEUAR 2.6 11/01/2021    LYMAR 3.0 11/01/2021     No results found for: "CEA", "CA19", "PSAD", "CHRA", "AFP", "CA153", "C2729", "PARA", "PARA2", "CA125"   Chemistries:  Lab Results   Component Value Date    NA 142 08/11/2021    K 3.6 08/11/2021    GLU 132 (H) 08/11/2021    CA 9.1 08/11/2021    BUN 11 08/11/2021    CREAT 0.7 08/11/2021    ALB 3.6 08/11/2021    ALT 39 08/11/2021    AST 16 08/11/2021    TBIL 0.3 08/11/2021    ALK 87 08/11/2021          Imaging:     DEXA 02/15/21:   Findings:                                                                   Current BMD                           T-Score                                                  Left Femur Neck  0.937                             -0.7  Right Femur Neck                                            0.927                             -0.8  Left Forearm                                            0.848                             -0.3      FRAX 10-year Probability of Fracture:    - Major Osteoporotic Fracture:  5.1%  - Hip Fracture:  0.2%    Impression:  Normal bone mineral density.  Repeat examination in 2 years is recommended.        Echo 07/2021  Previous Evaluation  This study was compared to the prior echocardiogram performed on 04/30/2021.  Left ventricular systolic function is essentially unchanged. LVEF was  previously 64%. GLS was -15%.      Conclusions  - Echo contrast was utilized.  - Calculated LVEF using Simpsons method is 63%.  - Average global longitudinal strain is -16.1% (measured on Philips platform).  - Normal right ventricle.  - No significant valvular  regurgitation or stenosis.           01/22/21 BREAST MRI  IMPRESSION:      Status post bilateral mastectomies with  placement of saline implants bilaterally.    Significant amount of  fluid collection is seen anterior to the right saline implant with increased cellularity of the entire soft tissue particularly right upper outer quadrant. There is also associated diffuse thickening of the skin. These findings are   nonspecific and may be secondary to inflammation or infection of the implant or partial rupture of the implant or lymphangitic spread. Puncture biopsy of the skin will be helpful.    Normal left breast implant.    No abnormal axillary lymphadenopathy seen bilaterally    ASSESSMENT:  BI-RADS Category 4:  Suspicious finding.    FOLLOW-UP:  Surgical biopsy/consultation.      Pathology: Refer to prior notes    Assessment:  16 mm grade 3 IDC, ER, PR, HER2+  12 mm grade 2 IDC, ER and PR positive, HER2-  4 mm grade 1 IDC, ER and PR positive, HER2-  Stage IA [id number] ER, PR, HER2+:  Patient presents status post 12 cycles of paclitaxel, continuing on 1 year of trastuzumab.  She has started anastrozole, had leg aching and stiffness, doing better on letrozole.  However, I did inform her that some of the residual symptoms she is still experiencing is likely from the letrozole rather than the paclitaxel.  Regardless, she finds it manageable and we are not going to switch medications.    Upcoming reconstruction.    Stated it was reasonable to get the adrenal imaging, the likelihood was that the adrenal nodule was benign.  Also on the  likely area for breast cancer to recur.    Discussed DEXA scan, normal bone density.  Discussed some of the data regarding adjuvant bisphosphonate therapy, though her risk of recurrence is low.  Given her normal bone density, were going to elect to continue surveillance, and I will order DEXA for October 2024.    On vitamin d    Previously on iron, retest hgb and iron  levels    Plan:  -follow up in 1 year  -continue letrozole  -vitamin d, continue  -next dexa 02/2023, ordered  -iron can be stopped, all labs wnl    Raynelle Hodgkins, MD  Hematology/Oncology  04/29/2022 at 8:20 AM         I spent 30 minutes total on this patient on this day.

## 2022-05-05 ENCOUNTER — Encounter: Payer: Self-pay | Admitting: Plastic Surgery

## 2022-05-05 ENCOUNTER — Other Ambulatory Visit: Payer: Self-pay

## 2022-05-05 ENCOUNTER — Inpatient Hospital Stay: Payer: PRIVATE HEALTH INSURANCE | Admitting: Anesthesiology

## 2022-05-05 ENCOUNTER — Encounter
Admission: RE | Disposition: A | Discharge status: Home / Self Care | Payer: Self-pay | Source: Ambulatory Visit | Attending: Plastic Surgery

## 2022-05-05 ENCOUNTER — Inpatient Hospital Stay
Admission: RE | Admit: 2022-05-05 | Discharge: 2022-05-08 | DRG: 580 | Disposition: A | Discharge status: Home / Self Care | Payer: PRIVATE HEALTH INSURANCE | Source: Ambulatory Visit | Attending: Plastic Surgery | Admitting: Plastic Surgery

## 2022-05-05 DIAGNOSIS — Y836 Removal of other organ (partial) (total) as the cause of abnormal reaction of the patient, or of later complication, without mention of misadventure at the time of the procedure: Secondary | ICD-10-CM | POA: Diagnosis present

## 2022-05-05 DIAGNOSIS — K219 Gastro-esophageal reflux disease without esophagitis: Secondary | ICD-10-CM | POA: Diagnosis present

## 2022-05-05 DIAGNOSIS — Z421 Encounter for breast reconstruction following mastectomy: Secondary | ICD-10-CM

## 2022-05-05 DIAGNOSIS — Y9289 Other specified places as the place of occurrence of the external cause: Secondary | ICD-10-CM

## 2022-05-05 DIAGNOSIS — M96843 Postprocedural seroma of a musculoskeletal structure following other procedure: Secondary | ICD-10-CM | POA: Diagnosis present

## 2022-05-05 DIAGNOSIS — C50911 Malignant neoplasm of unspecified site of right female breast: Secondary | ICD-10-CM

## 2022-05-05 DIAGNOSIS — J45909 Unspecified asthma, uncomplicated: Secondary | ICD-10-CM | POA: Diagnosis present

## 2022-05-05 DIAGNOSIS — Z853 Personal history of malignant neoplasm of breast: Secondary | ICD-10-CM

## 2022-05-05 DIAGNOSIS — I10 Essential (primary) hypertension: Secondary | ICD-10-CM | POA: Diagnosis present

## 2022-05-05 HISTORY — PX: PR BREAST RECONSTRUCTION W/FREE FLAP: 19364

## 2022-05-05 HISTORY — DX: Malignant neoplasm of unspecified site of right female breast: C50.911

## 2022-05-05 LAB — MCHC: MCHC: 35 g/dL (ref 32–36)

## 2022-05-05 LAB — TYPE AND SCREEN
ABO RH Blood Type: A POS
Antibody Screen: NEGATIVE

## 2022-05-05 LAB — GRAM STAIN

## 2022-05-05 LAB — HEMATOCRIT: Hematocrit: 37 % (ref 34–49)

## 2022-05-05 LAB — POCT GLUCOSE: Glucose POCT: 124 mg/dL — ABNORMAL HIGH (ref 60–99)

## 2022-05-05 SURGERY — RECONSTRUCTION, BREAST, USING DIEP FREE FLAP
Anesthesia: General | Site: Breast | Laterality: Bilateral | Wound class: Clean

## 2022-05-05 MED ORDER — ROCURONIUM BROMIDE 10 MG/ML IV SOLN *WRAPPED*
Status: AC
Start: 2022-05-05 — End: 2022-05-05
  Filled 2022-05-05: qty 5

## 2022-05-05 MED ORDER — HEPARIN SODIUM 5000 UNIT/ML SQ *I*
SUBCUTANEOUS | Status: AC
Start: 2022-05-05 — End: 2022-05-05
  Filled 2022-05-05: qty 1

## 2022-05-05 MED ORDER — SODIUM CHLORIDE 0.9 % FLUSH FOR PUMPS *I*
0.0000 mL/h | INTRAVENOUS | Status: DC | PRN
Start: 2022-05-05 — End: 2022-05-08

## 2022-05-05 MED ORDER — VANCOMYCIN HCL 1000 MG IV SOLR WRAPPED *I*
INTRAVENOUS | Status: AC
Start: 2022-05-05 — End: 2022-05-05
  Filled 2022-05-05: qty 10

## 2022-05-05 MED ORDER — MIDAZOLAM HCL 1 MG/ML IJ SOLN *I* WRAPPED
INTRAMUSCULAR | Status: AC
Start: 2022-05-05 — End: 2022-05-05
  Filled 2022-05-05: qty 2

## 2022-05-05 MED ORDER — PAROXETINE HCL 12.5 MG PO TB24 *I*
25.0000 mg | ORAL_TABLET | Freq: Every morning | ORAL | Status: DC
Start: 2022-05-05 — End: 2022-05-05

## 2022-05-05 MED ORDER — HEPARIN SODIUM 5000 UNIT/ML SQ *I*
5000.0000 [IU] | Freq: Once | SUBCUTANEOUS | Status: AC
Start: 2022-05-05 — End: 2022-05-05
  Administered 2022-05-05: 5000 [IU] via SUBCUTANEOUS
  Filled 2022-05-05: qty 1

## 2022-05-05 MED ORDER — PAPAVERINE HCL 30 MG/ML IJ SOLN *I*
INTRAMUSCULAR | Status: DC | PRN
Start: 2022-05-05 — End: 2022-05-05
  Administered 2022-05-05: 2 mL via TOPICAL

## 2022-05-05 MED ORDER — ACETAMINOPHEN 325 MG PO TABS *I*
650.0000 mg | ORAL_TABLET | Freq: Three times a day (TID) | ORAL | Status: DC
Start: 2022-05-05 — End: 2022-05-05
  Filled 2022-05-05: qty 2

## 2022-05-05 MED ORDER — SODIUM CHLORIDE 0.9 % IV SOLN WRAPPED *I*
20.0000 mL/h | Status: DC
Start: 2022-05-05 — End: 2022-05-07

## 2022-05-05 MED ORDER — MEPERIDINE HCL 25 MG/ML IJ SOLN *I*
12.5000 mg | INTRAMUSCULAR | Status: DC | PRN
Start: 2022-05-05 — End: 2022-05-05

## 2022-05-05 MED ORDER — BUPIVACAINE HCL 0.25 % IJ SOLUTION *WRAPPED*
Status: AC
Start: 2022-05-05 — End: 2022-05-05
  Filled 2022-05-05: qty 30

## 2022-05-05 MED ORDER — HYDROMORPHONE HCL PF 1 MG/ML IJ SOLN *WRAPPED*
0.5000 mg | INTRAMUSCULAR | Status: DC | PRN
Start: 2022-05-05 — End: 2022-05-05
  Administered 2022-05-05 (×2): 0.5 mg via INTRAVENOUS
  Filled 2022-05-05 (×2): qty 0.5

## 2022-05-05 MED ORDER — HYDROMORPHONE HCL PF 1 MG/ML IJ SOLN *WRAPPED*
INTRAMUSCULAR | Status: AC
Start: 2022-05-05 — End: 2022-05-05
  Filled 2022-05-05: qty 1

## 2022-05-05 MED ORDER — ONDANSETRON HCL 2 MG/ML IV SOLN *I*
4.0000 mg | Freq: Once | INTRAMUSCULAR | Status: AC | PRN
Start: 2022-05-05 — End: 2022-05-05

## 2022-05-05 MED ORDER — PROPOFOL 10 MG/ML IV EMUL (INTERMITTENT DOSING) WRAPPED *I*
INTRAVENOUS | Status: DC | PRN
Start: 2022-05-05 — End: 2022-05-05
  Administered 2022-05-05: 200 mg via INTRAVENOUS

## 2022-05-05 MED ORDER — ONDANSETRON HCL 2 MG/ML IV SOLN *I*
4.0000 mg | Freq: Four times a day (QID) | INTRAMUSCULAR | Status: DC | PRN
Start: 2022-05-05 — End: 2022-05-08

## 2022-05-05 MED ORDER — GENTAMICIN SULFATE 40 MG/ML IJ SOLN *I*
INTRAMUSCULAR | Status: AC
Start: 2022-05-05 — End: 2022-05-05
  Filled 2022-05-05: qty 4

## 2022-05-05 MED ORDER — AMLODIPINE BESYLATE 5 MG PO TABS *I*
5.0000 mg | ORAL_TABLET | Freq: Every day | ORAL | Status: DC
Start: 2022-05-07 — End: 2022-05-08
  Administered 2022-05-07 – 2022-05-08 (×2): 5 mg via ORAL
  Filled 2022-05-05 (×2): qty 1

## 2022-05-05 MED ORDER — DEXAMETHASONE SODIUM PHOSPHATE 4 MG/ML INJ SOLN *WRAPPED*
INTRAMUSCULAR | Status: AC
Start: 2022-05-05 — End: 2022-05-05
  Filled 2022-05-05: qty 2

## 2022-05-05 MED ORDER — PROPOFOL 10 MG/ML IV EMUL (INTERMITTENT DOSING) WRAPPED *I*
INTRAVENOUS | Status: AC
Start: 2022-05-05 — End: 2022-05-05
  Filled 2022-05-05: qty 20

## 2022-05-05 MED ORDER — OXYCODONE HCL 5 MG PO TABS *I*
10.0000 mg | ORAL_TABLET | ORAL | Status: DC | PRN
Start: 2022-05-05 — End: 2022-05-07
  Administered 2022-05-06: 10 mg via ORAL
  Filled 2022-05-05: qty 2

## 2022-05-05 MED ORDER — DOCUSATE SODIUM 100 MG PO CAPS *I*
100.0000 mg | ORAL_CAPSULE | Freq: Every day | ORAL | Status: DC
Start: 2022-05-05 — End: 2022-05-08
  Administered 2022-05-06 – 2022-05-08 (×3): 100 mg via ORAL
  Filled 2022-05-05 (×4): qty 1

## 2022-05-05 MED ORDER — HALOPERIDOL LACTATE 5 MG/ML IJ SOLN *I*
0.5000 mg | Freq: Once | INTRAMUSCULAR | Status: AC | PRN
Start: 2022-05-05 — End: 2022-05-05

## 2022-05-05 MED ORDER — MIDAZOLAM HCL 1 MG/ML IJ SOLN *I* WRAPPED
INTRAMUSCULAR | Status: DC | PRN
Start: 2022-05-05 — End: 2022-05-05
  Administered 2022-05-05: 2 mg via INTRAVENOUS

## 2022-05-05 MED ORDER — HEPARIN SODIUM 5000 UNIT/ML SQ *I*
5000.0000 [IU] | Freq: Three times a day (TID) | SUBCUTANEOUS | Status: DC
Start: 2022-05-05 — End: 2022-05-08
  Administered 2022-05-05 – 2022-05-08 (×8): 5000 [IU] via SUBCUTANEOUS
  Filled 2022-05-05 (×8): qty 1

## 2022-05-05 MED ORDER — OXYCODONE HCL 5 MG PO TABS *I*
5.0000 mg | ORAL_TABLET | ORAL | Status: DC | PRN
Start: 2022-05-05 — End: 2022-05-08
  Administered 2022-05-06 – 2022-05-07 (×6): 5 mg via ORAL
  Filled 2022-05-05 (×6): qty 1

## 2022-05-05 MED ORDER — PANTOPRAZOLE SODIUM 40 MG PO TBEC *I*
40.0000 mg | DELAYED_RELEASE_TABLET | Freq: Every morning | ORAL | Status: DC
Start: 2022-05-06 — End: 2022-05-08
  Administered 2022-05-06 – 2022-05-08 (×3): 40 mg via ORAL
  Filled 2022-05-05 (×3): qty 1

## 2022-05-05 MED ORDER — FENTANYL CITRATE 50 MCG/ML IJ SOLN *WRAPPED*
INTRAMUSCULAR | Status: AC
Start: 2022-05-05 — End: 2022-05-05
  Filled 2022-05-05: qty 2

## 2022-05-05 MED ORDER — ASPIRIN 81 MG PO CHEW *I*
81.0000 mg | CHEWABLE_TABLET | Freq: Every day | ORAL | Status: DC
Start: 2022-05-06 — End: 2022-05-08
  Administered 2022-05-06 – 2022-05-08 (×3): 81 mg via ORAL
  Filled 2022-05-05 (×3): qty 1

## 2022-05-05 MED ORDER — LACTATED RINGERS IV SOLN *I*
125.0000 mL/h | INTRAVENOUS | Status: DC
Start: 2022-05-05 — End: 2022-05-07
  Administered 2022-05-05 – 2022-05-07 (×5): 125 mL/h via INTRAVENOUS

## 2022-05-05 MED ORDER — LIDOCAINE HCL 2 % IJ SOLN *I*
INTRAMUSCULAR | Status: DC | PRN
Start: 2022-05-05 — End: 2022-05-05
  Administered 2022-05-05: 100 mg via INTRAVENOUS

## 2022-05-05 MED ORDER — NEOSTIGMINE METHYLSULFATE 1 MG/ML IJ SOLN WRAPPED *I*
Status: AC
Start: 2022-05-05 — End: 2022-05-05
  Filled 2022-05-05: qty 3

## 2022-05-05 MED ORDER — HYDROMORPHONE HCL PF 1 MG/ML IJ SOLN *WRAPPED*
0.5000 mg | INTRAMUSCULAR | Status: DC | PRN
Start: 2022-05-05 — End: 2022-05-07
  Administered 2022-05-06 (×3): 0.5 mg via INTRAVENOUS
  Filled 2022-05-05 (×3): qty 0.5

## 2022-05-05 MED ORDER — EPINEPHRINE 1 MG/ML IJ SOLUTION WRAPPED *I*
INTRAMUSCULAR | Status: AC
Start: 2022-05-05 — End: 2022-05-05
  Filled 2022-05-05: qty 1

## 2022-05-05 MED ORDER — ONDANSETRON HCL 2 MG/ML IV SOLN *I*
INTRAMUSCULAR | Status: DC | PRN
Start: 2022-05-05 — End: 2022-05-05
  Administered 2022-05-05 (×2): 4 mg via INTRAVENOUS

## 2022-05-05 MED ORDER — LACTATED RINGERS IV SOLN *I*
125.0000 mL/h | INTRAVENOUS | Status: DC
Start: 2022-05-05 — End: 2022-05-05
  Administered 2022-05-05: 125 mL/h via INTRAVENOUS

## 2022-05-05 MED ORDER — LIDOCAINE HCL 2 % (PF) IJ SOLN *I*
INTRAMUSCULAR | Status: AC
Start: 2022-05-05 — End: 2022-05-05
  Filled 2022-05-05: qty 5

## 2022-05-05 MED ORDER — LIDOCAINE HCL (PF) 1 % IJ SOLN *I*
0.1000 mL | INTRAMUSCULAR | Status: DC | PRN
Start: 2022-05-05 — End: 2022-05-08
  Administered 2022-05-05: 0.1 mL via SUBCUTANEOUS
  Filled 2022-05-05: qty 2

## 2022-05-05 MED ORDER — ALPRAZOLAM 0.5 MG PO TABS *I*
0.5000 mg | ORAL_TABLET | Freq: Every evening | ORAL | Status: DC | PRN
Start: 2022-05-06 — End: 2022-05-08

## 2022-05-05 MED ORDER — SUGAMMADEX SODIUM 100 MG/1ML IV SOLN *WRAPPED*
INTRAVENOUS | Status: AC
Start: 2022-05-05 — End: 2022-05-05
  Filled 2022-05-05: qty 2

## 2022-05-05 MED ORDER — INDOCYANINE GREEN 25 MG IV SOLR *I*
INTRAVENOUS | Status: DC | PRN
Start: 2022-05-05 — End: 2022-05-05
  Administered 2022-05-05 (×3): 5 mg via INTRAVENOUS

## 2022-05-05 MED ORDER — GLYCOPYRROLATE 0.2 MG/ML IJ SOLN *WRAPPED*
INTRAMUSCULAR | Status: AC
Start: 2022-05-05 — End: 2022-05-05
  Filled 2022-05-05: qty 4

## 2022-05-05 MED ORDER — ACETAMINOPHEN 650 MG/20.3 ML WRAPPED *I*
650.0000 mg | Freq: Three times a day (TID) | Status: DC | PRN
Start: 2022-05-05 — End: 2022-05-07
  Administered 2022-05-05 – 2022-05-07 (×3): 650 mg via ORAL
  Filled 2022-05-05 (×3): qty 20.3

## 2022-05-05 MED ORDER — CEFAZOLIN SODIUM 1000 MG IJ SOLR *I*
INTRAMUSCULAR | Status: AC
Start: 2022-05-05 — End: 2022-05-05
  Filled 2022-05-05: qty 20

## 2022-05-05 MED ORDER — ONDANSETRON HCL 2 MG/ML IV SOLN *I*
INTRAMUSCULAR | Status: AC
Start: 2022-05-05 — End: 2022-05-05
  Filled 2022-05-05: qty 2

## 2022-05-05 MED ORDER — PAPAVERINE HCL 30 MG/ML IJ SOLN *I*
INTRAMUSCULAR | Status: AC
Start: 2022-05-05 — End: 2022-05-05
  Filled 2022-05-05: qty 2

## 2022-05-05 MED ORDER — VENLAFAXINE HCL 75 MG PO CP24 *I*
75.0000 mg | ORAL_CAPSULE | Freq: Every day | ORAL | Status: DC
Start: 2022-05-06 — End: 2022-05-08
  Administered 2022-05-06 – 2022-05-08 (×3): 75 mg via ORAL
  Filled 2022-05-05 (×4): qty 1

## 2022-05-05 MED ORDER — LACTATED RINGERS IV SOLN *I*
20.0000 mL/h | INTRAVENOUS | Status: DC
Start: 2022-05-05 — End: 2022-05-07
  Administered 2022-05-05: 20 mL/h via INTRAVENOUS

## 2022-05-05 MED ORDER — ONE STEP DOSE TO ADMINISTER *I*
550.0000 mL | INTRAMUSCULAR | Status: DC
Start: 2022-05-05 — End: 2022-05-08
  Administered 2022-05-06: 550 mL
  Filled 2022-05-05 (×2): qty 550

## 2022-05-05 MED ORDER — LIDOCAINE-EPINEPHRINE 0.5 %-1:200000 IJ SOLN *I*
INTRAMUSCULAR | Status: AC
Start: 2022-05-05 — End: 2022-05-05
  Filled 2022-05-05: qty 50

## 2022-05-05 MED ORDER — LISINOPRIL 20 MG PO TABS *I*
40.0000 mg | ORAL_TABLET | Freq: Every day | ORAL | Status: DC
Start: 2022-05-07 — End: 2022-05-08
  Administered 2022-05-07 – 2022-05-08 (×2): 40 mg via ORAL
  Filled 2022-05-05 (×2): qty 2

## 2022-05-05 MED ORDER — HEPARIN SODIUM 5000 UNIT/ML SQ *I*
SUBCUTANEOUS | Status: DC | PRN
Start: 2022-05-05 — End: 2022-05-05
  Administered 2022-05-05: 5000 [IU] via SUBCUTANEOUS

## 2022-05-05 MED ORDER — CEFAZOLIN 2000 MG IN STERILE WATER 20ML SYRINGE *I*
2000.0000 mg | PREFILLED_SYRINGE | Freq: Three times a day (TID) | INTRAVENOUS | Status: DC
Start: 2022-05-05 — End: 2022-05-08
  Administered 2022-05-05 – 2022-05-08 (×8): 2000 mg via INTRAVENOUS
  Filled 2022-05-05 (×3): qty 20
  Filled 2022-05-05 (×7): qty 2000
  Filled 2022-05-05: qty 20
  Filled 2022-05-05: qty 2000

## 2022-05-05 MED ORDER — METOPROLOL SUCCINATE 25 MG PO TB24 *I*
50.0000 mg | ORAL_TABLET | Freq: Every day | ORAL | Status: DC
Start: 2022-05-06 — End: 2022-05-08
  Administered 2022-05-06 – 2022-05-08 (×3): 50 mg via ORAL
  Filled 2022-05-05 (×3): qty 2

## 2022-05-05 MED ORDER — PAPAVERINE HCL 30 MG/ML IJ SOLN *I*
INTRAMUSCULAR | Status: AC
Start: 2022-05-05 — End: 2022-05-05
  Filled 2022-05-05: qty 4

## 2022-05-05 MED ORDER — CYCLOBENZAPRINE HCL 10 MG PO TABS *I*
10.0000 mg | ORAL_TABLET | Freq: Three times a day (TID) | ORAL | Status: DC | PRN
Start: 2022-05-05 — End: 2022-05-08
  Administered 2022-05-07 – 2022-05-08 (×3): 10 mg via ORAL
  Filled 2022-05-05 (×3): qty 1

## 2022-05-05 MED ORDER — LABETALOL HCL 20 MG/4 ML IV SOLN WRAPPED *I*
5.0000 mg | INTRAVENOUS | Status: DC | PRN
Start: 2022-05-05 — End: 2022-05-05

## 2022-05-05 MED ORDER — PIPERACILLIN/TAZOBACTAM 3.375 G IN NS MINI-BAG PLUS 110 ML *I*
INTRAVENOUS | Status: AC
Start: 2022-05-05 — End: 2022-05-05
  Filled 2022-05-05: qty 110

## 2022-05-05 MED ORDER — SUGAMMADEX SODIUM 100 MG/1ML IV SOLN *WRAPPED*
INTRAVENOUS | Status: DC | PRN
Start: 2022-05-05 — End: 2022-05-05
  Administered 2022-05-05: 200 mg via INTRAVENOUS

## 2022-05-05 MED ORDER — DEXTROSE 5 % FLUSH FOR PUMPS *I*
0.0000 mL/h | INTRAVENOUS | Status: DC | PRN
Start: 2022-05-05 — End: 2022-05-07

## 2022-05-05 MED ORDER — DEXAMETHASONE SODIUM PHOSPHATE 4 MG/ML INJ SOLN *WRAPPED*
INTRAMUSCULAR | Status: DC | PRN
Start: 2022-05-05 — End: 2022-05-05
  Administered 2022-05-05: 8 mg via INTRAVENOUS

## 2022-05-05 MED ORDER — NONFORMULARY (OTHER) ORDER *I*
Status: DC | PRN
Start: 2022-05-05 — End: 2022-05-05
  Administered 2022-05-05: 500 mL

## 2022-05-05 MED ORDER — BUPIVACAINE-EPINEPHRINE 0.25 % IJ SOLUTION *WRAPPED*
INTRAMUSCULAR | Status: DC | PRN
Start: 2022-05-05 — End: 2022-05-05
  Administered 2022-05-05: 51 mL via SUBCUTANEOUS

## 2022-05-05 MED ORDER — HYDROMORPHONE HCL PF 1 MG/ML IJ SOLN *WRAPPED*
INTRAMUSCULAR | Status: DC | PRN
Start: 2022-05-05 — End: 2022-05-05
  Administered 2022-05-05 (×2): 1 mg via INTRAVENOUS

## 2022-05-05 MED ORDER — FENTANYL CITRATE 50 MCG/ML IJ SOLN *WRAPPED*
INTRAMUSCULAR | Status: DC | PRN
Start: 2022-05-05 — End: 2022-05-05
  Administered 2022-05-05: 100 ug via INTRAVENOUS

## 2022-05-05 MED ORDER — ROCURONIUM BROMIDE 10 MG/ML IV SOLN *WRAPPED*
Status: DC | PRN
Start: 2022-05-05 — End: 2022-05-05
  Administered 2022-05-05: 50 mg via INTRAVENOUS
  Administered 2022-05-05: 30 mg via INTRAVENOUS
  Administered 2022-05-05: 20 mg via INTRAVENOUS
  Administered 2022-05-05 (×3): 30 mg via INTRAVENOUS

## 2022-05-05 MED ORDER — ACETAMINOPHEN 325 MG PO TABS *I*
650.0000 mg | ORAL_TABLET | Freq: Three times a day (TID) | ORAL | Status: DC
Start: 2022-05-05 — End: 2022-05-07
  Filled 2022-05-05: qty 2

## 2022-05-05 MED ORDER — SODIUM CHLORIDE 0.9 % IV SOLN WRAPPED *I*
Status: DC | PRN
Start: 2022-05-05 — End: 2022-05-05

## 2022-05-05 MED ORDER — CEFAZOLIN 2000 MG IN STERILE WATER 20ML SYRINGE *I*
2000.0000 mg | PREFILLED_SYRINGE | Freq: Once | INTRAVENOUS | Status: AC
Start: 2022-05-05 — End: 2022-05-05
  Administered 2022-05-05 (×2): 2000 mg via INTRAVENOUS
  Filled 2022-05-05: qty 20

## 2022-05-05 SURGICAL SUPPLY — 52 items
APPLIER CLIP MCA20 SM 9-3/8 IN (Supply) ×2 IMPLANT
APPLIER CLIP MED SHORT 9-3/8IN (Supply) ×2 IMPLANT
BLADE FORCE TRIVERSE (Supply) ×1 IMPLANT
BULB RESERVOIR SUCT 100CC SILICONE (Supply) ×1 IMPLANT
CANNULA ANT CHAMBER 27G (Supply) ×1 IMPLANT
CLOSURE STERI-STRIP REINF .5 X 4IN LF (Dressing) ×8 IMPLANT
CORD ELECTROSURGICAL L12FT BIPOLAR DISPOSABLE FOR FOOT SWITCHING FORCEPS (Supply) ×1 IMPLANT
COUPLER ANAS DIA3.0MM G POLYETH SS GEM (Implant) ×2 IMPLANT
COUPLER FLO DIA2.5MM 20MHZ DOPP (Implant) ×2 IMPLANT
DRAIN WOUND RND SIL 19 FR HUBLESS (Supply) ×3 IMPLANT
DRESSING ABD PAD DERMACEA 5 X 9IN STER (Dressing) ×4 IMPLANT
DRESSING BIOPATCH 1IN X 7MM (Dressing) ×4 IMPLANT
DRESSING XEROFORM STR 1 IN X8 (Dressing) ×1 IMPLANT
ELECTRODE BLADE MODIFIED 2.5IN (Supply) ×1 IMPLANT
FILTER NEPTUNE 4PORT MANIFOLD (Supply) ×1 IMPLANT
GEL ULTRASOUND STER 20GM (Supply) ×1 IMPLANT
GLOVE BIOGEL PI MICRO IND UNDER SZ 6.5 LF (Glove) ×4 IMPLANT
GLOVE BIOGEL PI MICRO IND UNDER SZ 7.5 LF (Glove) ×2 IMPLANT
GLOVE SURG BIOGEL PI ULTRATOUCH SZ 6.0 (Glove) ×1 IMPLANT
GLOVE SURG BIOGEL PI ULTRATOUCH SZ 7.5 (Glove) ×2 IMPLANT
KIT DRAPE SPY-PHI DRUG (Other) ×1 IMPLANT
LIGASURE EXACT DISSECTOR (Supply) ×1 IMPLANT
PACK CUSTOM TRAM RECONSTRUCTION PACK (Pack) ×1 IMPLANT
PEN SURG MARKER REG TIP (Supply) IMPLANT
PENCIL SMOKE EVAC COATED PB (Supply) IMPLANT
PREP CHG DYNA-HEX LIQUID 4PCT 4OZ (Supply) ×1 IMPLANT
PUMP ON-Q PAIN 400 ML X 4 ML (Supply) ×1 IMPLANT
PUMP TUNNELER AND SHEATH 17GA X 8 (Supply) ×1 IMPLANT
SLEEVE COMP KNEE MED BLENDED (Supply) ×1 IMPLANT
SOL SOD CHL IV .9PCT 250ML (Drug) ×1 IMPLANT
SPEARS SURGICAL WECK (Supply) IMPLANT
SPONGE CURITY GAUZE 8PLY 2X2IN STER (Dressing) ×1 IMPLANT
SPONGE LAPAROTOMY W18XL18IN 7IN LOOP WHITE COTTON 4 PLY RADIOPAQUE PREWASHED DELINTED (Sponge) IMPLANT
SUTR ETHIBOND 0 18IN GREEN (Suture) IMPLANT
SUTR ETHIBOND 0 30IN CT-1 GREEN (Suture) IMPLANT
SUTR ETHILON MONO 8-0 BV130-5 BLACK (Suture) IMPLANT
SUTR ETHILON MONO 9-0 BV100-4 BLACK (Suture) IMPLANT
SUTR MONCRYL 4-0 PS-2 UND 27IN (Suture) ×2 IMPLANT
SUTR MONOCRYL PLUS 3-0PS2 27IN UNDY (Suture) ×6 IMPLANT
SUTR PDS II 0 CT-1 18IN VIOLET (Suture) ×2 IMPLANT
SUTR PDS II MONO 1 CT-1 27IN (Suture) ×4 IMPLANT
SUTR PLAIN GUT 5-0 PC-1 18 IN YELLOW (Suture) IMPLANT
SUTR PROLENE 0 CT-1 18IN C821G (Suture) IMPLANT
SUTR PROLENE 0 CTX 30IN 8454H (Suture) ×2 IMPLANT
SUTR PROLENE MONO 5-0 PS-2 BLUE (Suture) ×1 IMPLANT
SUTR SILK 2-0 FS 18 IN BLACK (Suture) ×2 IMPLANT
SUTR VICRYL ANTIB 2-0 CT1 8-18 UNDY (Suture) ×1 IMPLANT
SUTR VICRYL CTD 2-0 UND CR CT-1 VIOLET (Suture) ×2 IMPLANT
TIP SUCT FRAZIER CONTL VENT 10FR (Supply) IMPLANT
TOWEL SURGICAL W17XL27IN GREEN DELUXE NONFENESTRATED RADPQ PREWASHED DELINTED DISP (Towel) IMPLANT
TRAY SCRUB DRY SKIN INCLUDES 6 WINGED SPONGES 6 SPONGE STICKS 2 COTTON TIP APPLICATORS ABSORBENT/BLOTTING TOWELS PREMIUM (Supply) IMPLANT
TUBING 7/8IN SMOKE W/WAND (Tubing) ×1 IMPLANT

## 2022-05-05 NOTE — INTERIM OP NOTE (Signed)
Interim Op Note (Surgical Log ID: [id number])       Date of Surgery: 05/05/2022       Surgeons: Surgeon(s) and Role:     Kayren Eaves, MD - Primary   Assistants:         Pre-op Diagnosis: Pre-Op Diagnosis Codes:     * Personal history of malignant neoplasm of breast [Z85.3]       Post-op Diagnosis: Post-Op Diagnosis Codes:     * Personal history of malignant neoplasm of breast [Z85.3]       Procedure(s) Performed: Procedure(s) (LRB):  BILATERAL RECONSTRUCTION, BREAST, USING DIEP FREE FLAP (Bilateral)       Anesthesia Type: General        Fluid Totals: I/O this shift:  12/21 1500 - 12/21 2259  In: 1000 (10.7 mL/kg) [I.V.:1000]  Out: - (0 mL/kg)   Net: 1000  Weight: 93.4 kg        Estimated Blood Loss: 100 mL       Specimens to Pathology:  ID Type Source Tests Collected by Time Destination   1 : Right Breast Draining Seroma Cavity, Hx of infection FLUID Seroma fluid ANAEROBIC CULTURE, AEROBIC CULTURE Einar Grad, RN 05/05/2022 1308    A : Left Breast Total Capsulectomy with Mastectomy TISSUE Breast SURGICAL PATHOLOGY Einar Grad, RN 05/05/2022 6578    B : Right Mastectomy with Chronic Seroma Cavity TISSUE Breast SURGICAL PATHOLOGY Einar Grad, RN 05/05/2022 1000           Temporary Implants:        Packing:                 Patient Condition: good       Findings (Including unexpected complications): Bilateral DIEP flaps, JP x 4, OnQ pain pump, SPY for perfusion      Signed:  Mignon Pine, PA  on 05/05/2022 at 4:08 PM

## 2022-05-05 NOTE — Discharge Instructions (Signed)
Post-Op Information for DIEP Flap  Reconstructive Surgery   [facility]     Discharge date: 05/08/2022  Office phone: [phone]    Medication: take per Rx from office  Pain Control- Percocet, Vicodin or Tylenol can be taken every 3-4 hours as needed.     NO IBUPROFEN WHILE DRAINS ARE IN PLACE.  Muscle Relaxant- Flexeril- every 8 hours as needed for muscle soreness.   Antibiotics- Take as directed and with food to prevent nausea. Complete the full prescription.   Mirilax/Dulcolax/Senna/Smoothmove Tea -Constipation is common due to the pain medication and antibiotics. Use the laxative while on narcotics and until normal bowel movements retuen. You may take a stool softener (colace) daily.     Drains  Drains should be emptied and measured 3x a day, noting the color and the amount removed. The amount of the fluids that are still draining, as well as the color, will well [place] when your drains can be removed. Wash your hands thoroughly before emptying. When empty, squeeze the bulb to expel the air and close the plug. This will create the suction necessary to removed the fluid.   Drains are usually removed 5-10 days following surgery  Keep the incision around your back clean and dry.   There may be some discomfort and drainage around the drain sites. You may place a dressing over these areas for support, if needed.    Dressings  Steri-strips have been used over you incisions. Do not remove these steri-strips, the provider will change them at your post-operative appointment.     Sleeping  Sleeping will be most comfortable on your back with a propped up position. Support your back and arms with pillows.     Bathing  You may shower 3 days following surgery with your drains in place. Avoid soaking incisions in baths, hot tubs, and pools for the first 2 weeks and until incisions are completely healed.    Activities  Remain hinged when walking or when in bed. No strenous activity or lifting. Keep arms abducted as  much as possible.  Wear loose clothing that does not have to be pulled over your head. Normal activities may be resume din 6-8 weeks.    When to call the Doctor   Fever or chills of 101 or greater   Excessive drainage/bleeding from the incision   Severe pain unrelieved by pain medications   One-sided swelling (if one breast seems larger that the other one)  If the skin color becomes very dark purple and hard to the touch or very white and hot to the touch  If drains are in place, watch for excessive drainage or bleeding     Visiting Nurse  You may be contacted by the Visiting Nurse Service to provide you with some additional care. They will see how you are going and answer any questions that you may have before your next visit.    Next Appointment   You will be contacted by Dr.[facility]'s office following your return home (please be sure that the office has a phone number to reach you).   For emergencies or after office hours call our answering service at [phone]

## 2022-05-05 NOTE — Anesthesia Preprocedure Evaluation (Signed)
Anesthesia Pre-operative History and Physical for [name]  .  .  CPM/PAT Summary:  56 y.o. female with Personal history of malignant neoplasm of breast [Z85.3] presenting for Procedure(s) (LRB):  BILATERAL RECONSTRUCTION, BREAST, USING DIEP FREE FLAP (Bilateral) by Surgeon(s):  Kayren Eaves, MD scheduled for 450 minutes.  BMI Readings from Last 1 Encounters:  05/05/22 : 34.28 kg/m      By Iantha Fallen, MD at 8:16 AM on 05/05/2022    Anesthesia Evaluation Information Source: patient, records     ANESTHESIA HISTORY     Denies anesthesia history    GENERAL     Denies general issues    HEENT     Denies HEENT issues PULMONARY    + Asthma    CARDIOVASCULAR    + Hypertension    GI/HEPATIC/RENAL   NPO: > 8hrs ago (solids) and > 2hrs ago (clears)      + GERD     ENDO/OTHER     Denies endo issues             Physical Exam Not Completed________________________________________________________________________  PLAN  ASA Score  2  Anesthetic Plan general       Induction (routine IV) General Anesthesia/Sedation Maintenance Plan (inhaled agents);  Airway Manipulation (direct laryngoscopy); Airway (cuffed ETT); Line ( use current access); Monitoring (standard ASA); Positioning (supine); PONV Plan (dexamethasone and ondansetron); Pain (per surgical team and intraop local); PostOp (PACU)Standard Attestation    Informed Consent     Risks:          Risks discussed were commensurate with the plan listed above with the following specific points: N/V, aspiration, sore throat, hypotension, headache and dizziness, Damage to: teeth, eyes and nerves, allergic Rx, unexpected serious injury, awareness and death.    Anesthetic Consent:         Anesthetic plan (and risks as noted above) were discussed with patient    Responsible Anesthesia Provider Attestation:  I attest that the patient or proxy understands and accepts the risks and benefits of the anesthesia plan. I also attest that I have personally performed a pre-anesthetic  examination and evaluation, and prescribed the anesthetic plan for this particular location within 48 hours prior to the anesthetic as documented. Iantha Fallen, MD  05/05/22, 8:16 AM

## 2022-05-05 NOTE — Progress Notes (Signed)
05/05/22 2345   [facility] Patient Class Review   Patient Class Review Inpatient     Patient Class effective 05/05/2022    Helene Shoe, RN   Utilization Management Nurse  Office: [phone] / [phone]

## 2022-05-05 NOTE — Anesthesia Case Conclusion (Signed)
CASE CONCLUSION  Emergence  Actions:  Suctioned, soft bite block and extubated  Criteria Used for Airway Removal:  Adequate Tv & RR, acceptable O2 saturation and following commands  Assessment:  Routine  Transport  Directly to: PACU  Position:  Supine  Patient Condition on Handoff  Level of Consciousness:  Mildly sedated  Patient Condition:  Stable  Handoff Report to:  RN

## 2022-05-05 NOTE — Op Note (Unsigned)
PATIENTTAMANNA, Kendra [name] MR #:  [id number]   CSN:  [id number] DOB:  1965-12-15    AGE:  56     SURGEON:  Kayren Eaves, MD  CO-SURGEON:    ASSISTANTMignon Pine, PA.  SURGERY DATE:  05/05/2022    PREOPERATIVE DIAGNOSIS:  Breast cancer history, right breast, status post failed reconstruction with intact left reconstructed implant and right unhealed mastectomy skin with pocket.    POSTOPERATIVE DIAGNOSIS:  Breast cancer history, right breast, status post failed reconstruction with intact left reconstructed implant and right unhealed mastectomy skin with pocket.    PROCEDURE:    Bilateral delayed contralateral deep inferior epigastric perforator flap reconstruction of mastectomy defects.    Bilateral limited axillary dissection for access to the thoracodorsal vessels.    Left side removal of implant and complete capsulectomy.  Right side debridement of right chest wall seroma/infected pocket with skin.    COMPLICATIONS:  None.    CONDITION:  Satisfactory.    ANESTHESIA:  General.    IV FLUIDS:  Per anesthesia service.    INDICATIONS:  This is [name] 56 year old female who has been diagnosed with right breast cancer.  She has had bilateral mastectomies and failed reconstruction with exposure of her right-sided implant.  Her left side is intact but ptotic.  The patient has elected to have autologous tissue reconstruction and she understands the risks and benefits of surgery including, but not limited to, flap failure, vessel loss, need for further surgery, thrombosis of the tissue, hernia, donor site morbidity, hematoma, seroma, cosmetic deformity, loss of skin, and need for further surgery.  At this point the patient has [name] good concept of goals of surgery and wishes to proceed.    OPERATIVE PROCEDURE:  The patient was brought to the operating room, placed on the table in supine fashion.  Patient underwent general anesthesia per routine.  Once she was adequately anesthetized, she was prepped and draped of the  bilateral breasts and chest wall in [name] typical fashion.  Preoperatively, in the upright position, I designed incisions for surgery.  With the patient on the table, I started by opening up her left-sided chest wall.  I designed the skin excision so it would be similar to her right side which had [name] chronic open area from infection.  After excising the skin associated with the Wise pattern breast reconstruction, I went ahead and performed [name] total capsulectomy and then performed [name] limited axillary dissection for access to the thoracodorsal vessels. On the right side the same technique was done.  I excised and debrided the skin and the chronic seroma pocket after taking [name] culture of this including the entire pocket itself, and then I performed [name] limited left axillary dissection for access to the thoracodorsal vessels.  Once these were completed, I went ahead to the abdominal wall.  I made an incision down low in the pelvis.  This was done as [name] typical tummy tuck incision and came down, separated superficial inferior epigastric veins bilaterally for possible drainage and then made an upper incision about 2 cm above the umbilicus based on the CT scan showing [name] high perforator on the right-hand side.  Once this was done, I circumscribed the perforators, and on the right side I found [name] big medial perforator with [name] smaller secondary perforator close to this that was daisy chained and dissected through transverse muscle extension and then directly into the deep inferior epigastrics going down into the pelvis.  I cleared  the deep inferior epigastric vessels of intervening soft tissues during the exposure.  On the left side she had [name] big deep inferior epigastric perforator vessel that came around the muscle directly into the flap medially and I was able to use this for the entire reconstruction without really violating the muscle.  Both incisions for the fascia were no more than about 7 cm to 8 cm.  Once these were ready, I  brought up the contralateral right-sided flap up to the left-sided chest wall and the anastomotic phase began.  I began an end-to-end venous anastomosis of the deep epigastric vein and thoracodorsal vein using [name] 3 mm coupler, and then the arterial anastomosis of the deep inferior epigastric artery and thoracodorsal artery and the serratus bifurcation was done using [name] 2.5 mm coupler.  At the completion of this, I took the clamps off, allowed this to percolate in place, put this temporarily in the chest wall, and stapled it into place.  Because this contralateral side had [name] big appendectomy incision through the middle of it, all the tissue distal to the appendectomy incision was not viable and had to be removed, but I still had two thirds of the flap and that was enough to make [name] breast and I was able to temporarily staple this into the left-sided chest wall.  I went to the right-sided chest wall and I took the contralateral flap brought this up to the chest wall and turned the flap basically [name] total of 180 degrees and did [name] thoracodorsal anastomosis of deep inferior epigastric artery to the thoracodorsal artery at the bifurcation of the serratus branch, and that was done with [name] 2.5 mm coupler, and the venous vessels which were done before that is deep inferior epigastric vein to the thoracodorsal vein at the bifurcation using [name] 3 mm coupler.  Once I relieved the clamps, there was immediate perfusion to the flaps and these were percolating along nicely.  While these percolated along nicely, I turned my attention to the abdominal closure.  I went ahead and elevated the rest of the abdominal flap.  I closed the fascial incisions directly and then did full plication of the rectus muscle in the midline, both above and below the umbilicus and the extra suture that needed to be done and this was all done with PDS suture.  I irrigated this out.  We used antibiotic irrigation because of her previous infection and the  antibiotic irrigation had [name] combination of Zosyn, Gent, and Vanco.  I went ahead and irrigated this out.  I placed an On-Q pain pump via catheters into the subrectus space bilaterally.  I placed Marcaine directly into the abdomen for pain control right after surgery and then transposed and closed the abdominal flap in the normal fashion using [name] combination of Vicryl, Monocryl and externalized the umbilicus in appropriate anatomic location.  I then went to inset the flaps.  I deepithelialized the upper pole of the left-sided flap including the inferior and lateral portions of the flap and externalized [name] big swath of elliptical tissue.  I sutured the flap up laterally to hold this into place and I had not made [name] big incision for the thoracodorsal, so that we would not have the flap falling into the axilla.  Once this was done, I irrigated this out using the antibiotic solution and then temporarily stapled this and then closed this with Monocryl.  On the right side the same technique was done.  I also had to  suture this up laterally using Vicryl, and I was very careful to leave [name] vascular cuff without anything pushing against or crowding the anastomosis.  This was then de-epithelialized in the usual fashion, it was closed in the usual way, and I was able to get doppler signals on both of these before finishing the case.  At the end of the case, the patient was woken up; it was [name] deep extubation to protect her abdominal recovery and she did well.  There were no adverse events during extubation and the patient was sent to recovery in satisfactory condition.  22 Modifier for complexity, high BMI, DIEP flaps, infection and failed reconstruction history.             ______________________________  Kayren Eaves, MD    SJV/MODL  DD:  05/05/2022 16:13:26  DT:  05/05/2022 18:31:32  Job #:  [id number]    cc:

## 2022-05-05 NOTE — Anesthesia Procedure Notes (Signed)
---------------------------------------------------------------------------------------------------------------------------------------    AIRWAY   GENERAL INFORMATION AND STAFF    Patient location during procedure: OR       Date of Procedure: 05/05/2022 9:02 AM  CONDITION PRIOR TO MANIPULATION     Current Airway/Neck Condition:  Normal        For more airway physical exam details, see Anesthesia PreOp Evaluation  AIRWAY METHOD     Patient Position:  Sniffing    Preoxygenated: yes      Induction: IV      Technique Used for Successful ETT Placement:  Direct laryngoscopy    Blade Type:  Macintosh    Laryngoscope Blade/Video laryngoscope Blade Size:  3    Cormack-Lehane Classification:  Grade I - full view of glottis    Placement Verified by: capnometry      Number of Attempts at Approach:  1  FINAL AIRWAY DETAILS    Final Airway Type:  Endotracheal airway    Adjunct Airway: oropharyngeal airway (OPA)    Final Endotracheal Airway:  ETT      Cuffed: cuffed    Insertion Site:  Oral    ETT Size (mm):  7.0  ----------------------------------------------------------------------------------------------------------------------------------------

## 2022-05-05 NOTE — Progress Notes (Signed)
Plastic Surgery Post-Op Check Note    Subjective:    S/P Bilateral deep breast reconstruction today. Pain currently 5/10, mostly in abdomen.  Pain adequately controlled. Currently NPO. Not ambulating per orders. No nausea and vomiting.  Denies chest pain, shortness of breath, dizziness, weakness.      Physical Exam:    Vitals: BP 122/65 (BP Location: Left leg)   Pulse 104   Temp 37.1 C (98.8 F) (Infrared)   Resp 16   Ht 1.651 m (5\' 5" )   Wt 93.4 kg (206 lb)   SpO2 96%   BMI 34.28 kg/m   Pain Score:      GEN: NAD   CV: RR  ABD: soft, ATTP, ND, JP x4 light serosang, incisions CDI without hematoma  CHEST: incisions CDI without hematoma, ATT  GU: Foley in place  NEURO/MOTOR: alert and oriented, no focal deficits  EXTREMITIES: warm, well perfused      I/O last 3 completed shifts:  12/20 1500 - 12/21 1459  In: 3000 (32.1 mL/kg) [I.V.:3000 (1.3 mL/kg/hr)]  Out: 400 (4.3 mL/kg) [Urine:300 (0.1 mL/kg/hr); Blood:100]  Net: 2600  Weight: 93.4 kg      Laboratory values:    Recent Labs     05/05/22  0706   Hematocrit 37    No results for input(s): "NA", "K", "CL", "CO2", "UN", "CREAT", "GLU", "CA", "MG", "PO4", "ALB", "PALB", "ESR", "CRP" in the last 72 hours.     Recent Labs     05/05/22  0658   Glucose POCT 124*        Imaging: No results found.     Impression: [name] is a 56 y.o. female  now Day of Surgery from bilateral delayed deep breast reconstruction. Doing well post operatively.      Plan:    - Adequate analgesia   - NPO  - mIVF   - Home meds   - pending abx plan per Dr. Cena Benton for bilateral gram + cocci on gram stain           Sedalia Muta, MD   05/05/22  7:38 PM

## 2022-05-05 NOTE — Progress Notes (Signed)
UPDATES TO PATIENT'S CONDITION on the DAY OF SURGERY/PROCEDURE    I. Updates to Patient's Condition (to be completed by a provider privileged to complete a H&P, following reassessment of the patient by the provider):    Day of Surgery/Procedure Update:  History  History reviewed and no change    Physical  Physical exam updated and no change            II. Procedure Readiness   I have reviewed the patient's H&P and updated condition. By completing and signing this form, I attest that this patient is ready for surgery/procedure.    III. Attestation   I have reviewed the updated information regarding the patient's condition and it is appropriate to proceed with the planned surgery/procedure.      Mignon Pine, [place] as of 8:08 AM 05/05/2022

## 2022-05-06 ENCOUNTER — Encounter: Payer: Self-pay | Admitting: Plastic Surgery

## 2022-05-06 NOTE — Plan of Care (Signed)
Writer in to discuss home care options with patient and husband around noon today.   Declined offer of home skilled nursing services; pt and husband familiar with drains, discussed home nursing would likely not be there daily, pt has follow up with Dr. Cena Benton on Dec. 26th and 29th.  Pt is aware of need to call Dr. Kirby Crigler office if she feels increased pain, and knows to call if signs of infections like redness, warmth at site, fever/chills.  Merwyn Katos, [facility]  RN Care Coordinator  [phone]  3:00 PM

## 2022-05-06 NOTE — Plan of Care (Signed)
Problem: Safety  Goal: Patient will remain free of falls  Outcome: Maintaining  Goal: Prevent any intentional injury  Outcome: Maintaining     Problem: Pain/Comfort  Goal: Patient's pain or discomfort is manageable  Outcome: Maintaining     Problem: Nutrition  Goal: Nutritional status is maintained or improved - Geriatric  Outcome: Maintaining     Problem: Mobility  Goal: Functional status is maintained or improved - Geriatric  Outcome: Maintaining     Problem: Psychosocial  Goal: Demonstrates ability to cope with illness  Outcome: Maintaining     Problem: Cognitive function  Goal: Cognitive function will be maintained or return to baseline  Description: Interventions:  Delirium Assessment  LIVEBAR Assessment    Outcome: Maintaining  Goal: Lines and tethers will be removed as appropriate  Outcome: Maintaining  Goal: Patient maintains appropriate nutritional intake  Outcome: Maintaining  Goal: Vital signs will be within normal limits  Outcome: Maintaining  Goal: Evidence for potential causes of delirium will be managed  Outcome: Maintaining  Goal: Behaviors will return to baseline  Outcome: Maintaining  Goal: Ambulation and mobility will be maintained  Outcome: Maintaining  Goal: Retention of urine and constipation will be managed  Outcome: Maintaining     Problem: Potential Alteration in Skin Integrity - Pressure Ulcer  Goal: The patient will maintain intact skin free of pressure ulcer  Outcome: Maintaining     Problem: Risk for Impaired Sleep/Wake Cycle  Goal: The patient will maintain an adequate sleep/wake cycle  Outcome: Maintaining     Problem: Bowel Elimination  Goal: Elimination pattern is normal or improving  Outcome: Maintaining     Problem: Bladder Elimination  Goal: Patient is able to empty bladder or return to baseline  Outcome: Maintaining

## 2022-05-06 NOTE — Plan of Care (Signed)
Problem: Pain/Comfort  Goal: Patient's pain or discomfort is manageable  Outcome: Progressing towards goal     Problem: Mobility  Goal: Functional status is maintained or improved - Geriatric  Outcome: Progressing towards goal     Problem: Cognitive function  Goal: Ambulation and mobility will be maintained  Outcome: Progressing towards goal     Problem: Safety  Goal: Patient will remain free of falls  Outcome: Maintaining  Goal: Prevent any intentional injury  Outcome: Maintaining     Problem: Nutrition  Goal: Nutritional status is maintained or improved - Geriatric  Outcome: Maintaining     Problem: Psychosocial  Goal: Demonstrates ability to cope with illness  Outcome: Maintaining     Problem: Cognitive function  Goal: Cognitive function will be maintained or return to baseline  Description: Interventions:  Delirium Assessment  LIVEBAR Assessment    Outcome: Maintaining  Goal: Lines and tethers will be removed as appropriate  Outcome: Maintaining  Goal: Patient maintains appropriate nutritional intake  Outcome: Maintaining  Goal: Vital signs will be within normal limits  Outcome: Maintaining  Goal: Evidence for potential causes of delirium will be managed  Outcome: Maintaining  Goal: Behaviors will return to baseline  Outcome: Maintaining  Goal: Retention of urine and constipation will be managed  Outcome: Maintaining     Problem: Potential Alteration in Skin Integrity - Pressure Ulcer  Goal: The patient will maintain intact skin free of pressure ulcer  Outcome: Maintaining     Problem: Risk for Impaired Sleep/Wake Cycle  Goal: The patient will maintain an adequate sleep/wake cycle  Outcome: Maintaining     Problem: Bowel Elimination  Goal: Elimination pattern is normal or improving  Outcome: Maintaining     Problem: Bladder Elimination  Goal: Patient is able to empty bladder or return to baseline  Outcome: Maintaining

## 2022-05-06 NOTE — Progress Notes (Addendum)
Progress Note      S: Patient doing fairly well. Pain well-controlled at this time. Most of her pain was in her abdomen overnight. Also noting some numbness in her left hand, mild headache, and states she was coughing a lot last night. Coughing has improved some and she feels headache is from lack of caffeine, says she may try some black coffee today. Reassured her that numbness is likely positional from having arms abducted. Denies f/c, n/v, chest pain or shortness of breath. Happy to get clears today and get up out of bed..      O:  Vitals:    05/06/22 0446   BP: 132/65   Pulse: 77   Resp: 16   Temp: 36.9 C (98.4 F)   Weight:    Height:       Gen: Resting comfortably in bed, arms slightly abducted on pillows, NAD  Pulm: Respirations easy, CTAB  CV: RRR  Chest: Bilateral chest flap incisions intact and covered with steri strips, no ecchymosis or erythema. Flaps are warm with brisk cap refill. Bilateral JP drains with minimal ss output, both drains stripped by Clinical research associate. Strong doppler pulses bilaterally found directly over stitch.  Abd: Abdominal incision intact and covered with steri strips. Umbilical dressing c/d/I. OnQ pump in place. No ecchymosis or erythema noted. Bilateral JP drains with minimal ss output. Drains stripped by Clinical research associate. Hypoactive BS. Soft, non-distended. Appropriate diffuse TTP.  GU: Foley in place draining clear yellow urine  Extremities: WWP, no LE tenderness or edema. Patient only had 1 SCD sleeve on/in the room.        Intake/Output Summary (Last 24 hours) at 05/06/2022 0759  Last data filed at 05/06/2022 1610  Gross per 24 hour   Intake 5124.71 ml   Output 2113 ml   Net 3011.71 ml      Drain out put since surgery:    RB: 60ccs  LB: 43ccs  RA: 55ccs  LA: 80ccs      A: 56y/o F POD 1 s/p Bilateral breast reconstruction using Diep flap, progressing well      P:  -PRN pain management  -Q1 hour doppler flap checks until 4pm, then Q2 hour checks for the next 24 hours, then Q4 hour checks until  discharge  -Advance to clear liquid diet today  -OOB with assistance to the chair today  -Routine foley care, likely removed tomorrow morning  -JP drain care x 4  -DVT Ppx: SQH TID, and begin 81mg  ASA today. SCDs, discussed with nursing and asked them to get patient a second SCD sleeve  -Bowel Regimen  -Home meds as able  -Dispo: 1-2 days pending progress      Monna Fam PA-C    ADDM: Called to see patient regarding catheter not draining well and patient describing some bladder fullness. Nurse unable to bladder scan given OnQ pump and incision directly over suprapubic region. Patient had been noted to have some catheter dysfunction overnight with resolution after nursing repositioned catheter. Problem happened again this morning and then when patient got up to the chair had a large volume empty from her bladder, >500ccs. Nurse tried repositioning the afternoon when catheter was again not draining, including deflating balloon, advancing and re inflating  but she was unable to get any to flow. Writer attempted to do the same. Deflating balloon, advancing catheter slightly then re inflating. Small amount of clear yellow urine produced in tubing, but patient remained somewhat uncomfortable. Catheter tubing was somewhat coiled so writer uncoiled  and reposition bag toward the foot of the bed. Catheter was withdrawn somewhat until balloon was felt to be up against internal bladder wall. Steady slow flow of clear yellow urine began to flow into tubing and seemed to continue thereafter, though very slowly. Patient expressed relief of the bladder fullness. Her legs were positioned slightly apart as I think this may have help it to drain.    Patient tolerating clears okay, feel her bowels are starting to wake up though unsure if she has passed flatus yet. Ambulated well to and from chair though with some pain. No further complaints after resolution of bladder fullness.    -Plan as above      [employer] PA-C

## 2022-05-06 NOTE — Anesthesia Postprocedure Evaluation (Signed)
Anesthesia Post-Op Note    Patient: [name]    Procedure(s) Performed:  Procedure Summary  Date:  05/05/2022 Anesthesia Start: 05/05/2022  8:40 AM Anesthesia Stop: 05/05/2022  4:07 PM Room / Location:  [id number] / [facility] MAIN OR   Procedure(s):  BILATERAL RECONSTRUCTION, BREAST, USING DIEP FREE FLAP Diagnosis:  Personal history of malignant neoplasm of breast [Z85.3] Surgeon(s):  Kayren Eaves, MD Responsible Anesthesia Provider:  Iantha Fallen, MD         Recovery Vitals  BP: 132/65 (05/06/2022  4:46 AM)  Heart Rate: 77 (05/06/2022  4:46 AM)  Heart Rate (via Pulse Ox): 105 (05/05/2022  5:30 PM)  Resp: 16 (05/06/2022  4:46 AM)  Temp: 36.9 C (98.4 F) (05/06/2022  4:46 AM)  SpO2: 97 % (05/06/2022  4:46 AM)  O2 Flow Rate: 2 L/min (05/06/2022  4:46 AM)   0-10 Scale: 3 (05/06/2022  4:01 AM)    Anesthesia type:  general  Complications Noted During Procedure or in PACU:  None   Comment:    Patient Location:  [facility]  Level of Consciousness:    Recovered to baseline, awake, oriented and alert  Patient Participation:     Able to participate  Temperature Status:    Normothermic  Oxygen Saturation:    Within patient's normal range  Cardiac Status:   within patient's normal range  Fluid Status:    Stable  Airway Patency:     Yes  Pulmonary Status:    Baseline  Pain Management:    Adequate analgesia  Nausea and Vomiting:  None    Post Op Assessment:    Tolerated procedure well  Responsible Anesthesia Provider Attestation:  All indicated post anesthesia care provided       -

## 2022-05-07 MED ORDER — ACETAMINOPHEN 500 MG PO TABS *I*
1000.0000 mg | ORAL_TABLET | Freq: Three times a day (TID) | ORAL | Status: DC
Start: 2022-05-07 — End: 2022-05-07

## 2022-05-07 MED ORDER — OXYCODONE HCL 5 MG PO TABS *I*
10.0000 mg | ORAL_TABLET | ORAL | Status: DC | PRN
Start: 2022-05-07 — End: 2022-05-08

## 2022-05-07 MED ORDER — LACTATED RINGERS IV SOLN *I*
50.0000 mL/h | INTRAVENOUS | Status: DC
Start: 2022-05-07 — End: 2022-05-08
  Administered 2022-05-07 – 2022-05-08 (×2): 50 mL/h via INTRAVENOUS

## 2022-05-07 MED ORDER — ACETAMINOPHEN 650 MG/20.3 ML WRAPPED *I*
961.0000 mg | Freq: Three times a day (TID) | Status: DC
Start: 2022-05-07 — End: 2022-05-08
  Administered 2022-05-07 – 2022-05-08 (×2): 961 mg via ORAL
  Filled 2022-05-07 (×3): qty 40.6

## 2022-05-07 NOTE — Progress Notes (Addendum)
Foley removed per provider order at [phone].     Voiding spontaneous 1 unmeasured void,and one void for 400cc.  Pt ambulating to and from chair and bathroom 1A, tolerating well.     Q2 Doppler checks performed per order, good pulses and CMS.     JP drains teaching completed w/ pt. Pt states she has had drains in the past and is comfortable performing drain care.

## 2022-05-07 NOTE — Progress Notes (Signed)
Surg PA  POD#2    S: Pt states feeling a little better each day, still notes distal left numbness at finger tips. Tol PO fluids, foley in place, + OOB to chair. Denies CP/SOB, F/C, N/V.    O: BP 154/71 (BP Location: Left leg)   Pulse 62   Temp 36.4 C (97.5 F) (Infrared)   Resp 16   Ht 1.651 m (5\' 5" )   Wt 93.4 kg (206 lb)   SpO2 100%   BMI 34.28 kg/m     Gen: lying in bed, NAD  CV: RRR  Pulm: reg/unlabored  Chest: bilateral flaps pink WWP, good cap refill, strong signals, incisions C/D/I w/ Steri-strips, JPs with SS drng, no lateral collections palpated  Abd: soft, appropriate TTP, JPs w/ SS drng, incisions C/D/I w/ Steri-strips, umbo drsg C/D/I  GU: foley drng clear very light yellow UOP  Ext: MAE, NCT, no edema noted      Intake/Output Summary (Last 24 hours) at 05/07/2022 0830  Last data filed at 05/07/2022 1610  Gross per 24 hour   Intake 2874.47 ml   Output 2695 ml   Net 179.47 ml         A/P: [name] is a 56 yo female POD#2 s/p Bilateral breast DIEP flap reconstruction    - PO pain control  - ADAT  - enc OOB amb in hall today  - d/c foley, VT  - flap checks  - cont JPs x4  - hopeful d/c tomorrow    Donivan Scull, PA

## 2022-05-08 NOTE — Progress Notes (Signed)
Surg PA  POD#3    S: Pt immediately states being ready to go home today upon entry, feeling well with pain controlled. Tol PO diet, voiding freely, + OOB amb. Denies CP/SOB, F/C, N/V.    O: BP 154/79 (BP Location: Left leg)   Pulse 69   Temp 36.8 C (98.2 F) (Infrared)   Resp 18   Ht 1.651 m (5\' 5" )   Wt 93.4 kg (206 lb)   SpO2 99%   BMI 34.28 kg/m      Gen: lying in bed, NAD  CV: RRR  Pulm: reg/unlabored  Chest: bilateral flaps pink WWP, good cap refill, strong signals, incisions C/D/I w/ Steri-strips, JPs with mostly serous drng, no lateral collections palpated  Abd: soft, appropriate TTP, JPs w/ SS drng, incisions C/D/I w/ Steri-strips, umbo viable with thin ecchymotic rim to left superolateral edge  Ext: MAE, NCT, no edema noted      Intake/Output Summary (Last 24 hours) at 05/08/2022 1109  Last data filed at 05/08/2022 1046  Gross per 24 hour   Intake 370 ml   Output 3665 ml   Net -3295 ml         A/P: GEORGETT DEPA is a 56 yo female POD#3 s/p Bilateral breast DIEP flap reconstruction    - ready for d/c home  - Rxs already given by office and picked up by pt  - OP f/up this week with Dr Eartha Inch, PA

## 2022-05-08 NOTE — Discharge Summary (Signed)
Name: [name] MRN: [id number] DOB: 10-14-1965     Admit Date: 05/05/2022   Date of Discharge: 05/08/2022     Patient was accepted for discharge to   Home or Self Care [1]           Discharge Attending Physician: Kayren Eaves, MD      Hospitalization Summary    CONCISE NARRATIVE: Pt was admitted following the given operative procedure. She progressed well and was tolerating a diet, voiding freely and ambulating. She was then safely d/c home on 05/08/2022 after receiving teaching for 4 operative drains and abdominal On-Q pump which were left in place with removal plans in office. She will f/up later this week with Dr Cena Benton.        OR PROCEDURE: Bilateral breast DIEP flap reconstruction                   Signed: Donivan Scull, PA  On: 05/08/2022  at: 11:21 AM

## 2022-05-08 NOTE — Progress Notes (Signed)
Pt educated on discharge instructions. Pt awaiting husband for transportation. Piv removed. Merrily Brittle, RN

## 2022-05-10 LAB — AEROBIC CULTURE

## 2022-05-11 LAB — ANAEROBIC CULTURE

## 2022-05-17 LAB — SURGICAL PATHOLOGY

## 2022-09-05 ENCOUNTER — Emergency Department (HOSPITAL_BASED_OUTPATIENT_CLINIC_OR_DEPARTMENT_OTHER): Payer: BLUE CROSS/BLUE SHIELD

## 2022-09-05 ENCOUNTER — Emergency Department (HOSPITAL_BASED_OUTPATIENT_CLINIC_OR_DEPARTMENT_OTHER)
Admission: EM | Admit: 2022-09-05 | Discharge: 2022-09-05 | Disposition: A | Discharge status: Home / Self Care | Payer: BLUE CROSS/BLUE SHIELD | Attending: Emergency Medicine | Admitting: Emergency Medicine

## 2022-09-05 ENCOUNTER — Encounter (HOSPITAL_BASED_OUTPATIENT_CLINIC_OR_DEPARTMENT_OTHER): Payer: Self-pay | Admitting: Urology

## 2022-09-05 DIAGNOSIS — R42 Dizziness and giddiness: Secondary | ICD-10-CM

## 2022-09-05 DIAGNOSIS — I10 Essential (primary) hypertension: Secondary | ICD-10-CM | POA: Insufficient documentation

## 2022-09-05 DIAGNOSIS — E119 Type 2 diabetes mellitus without complications: Secondary | ICD-10-CM | POA: Insufficient documentation

## 2022-09-05 HISTORY — DX: Type 2 diabetes mellitus without complications: E11.9

## 2022-09-05 HISTORY — DX: Essential (primary) hypertension: I10

## 2022-09-05 LAB — CBC
HCT: 39 % (ref 36.0–46.0)
Hemoglobin: 13.1 g/dL (ref 12.0–15.0)
MCH: 30.2 pg (ref 26.0–34.0)
MCHC: 33.6 g/dL (ref 30.0–36.0)
MCV: 89.9 fL (ref 80.0–100.0)
Platelets: 375 10*3/uL (ref 150–400)
RBC: 4.34 MIL/uL (ref 3.87–5.11)
RDW: 13.6 % (ref 11.5–15.5)
WBC: 9.8 10*3/uL (ref 4.0–10.5)
nRBC: 0 % (ref 0.0–0.2)

## 2022-09-05 LAB — TROPONIN I (HIGH SENSITIVITY)
Troponin I (High Sensitivity): <2 ng/L (ref <18)
Troponin I (High Sensitivity): <2 ng/L (ref <18)

## 2022-09-05 LAB — BASIC METABOLIC PANEL
Anion gap: 11 (ref 5–15)
BUN: 19 mg/dL (ref 6–20)
CO2: 26 mmol/L (ref 22–32)
Calcium: 8.9 mg/dL (ref 8.9–10.3)
Chloride: 97 mmol/L — ABNORMAL LOW (ref 98–111)
Creatinine, Ser: 0.58 mg/dL (ref 0.44–1.00)
GFR, Estimated: >60 mL/min (ref >60)
Glucose, Bld: 183 mg/dL — ABNORMAL HIGH (ref 70–99)
Potassium: 3.3 mmol/L — ABNORMAL LOW (ref 3.5–5.1)
Sodium: 134 mmol/L — ABNORMAL LOW (ref 135–145)

## 2022-09-05 NOTE — Discharge Instructions (Addendum)
It was a pleasure taking care of you. Your cardiac blood work here in the Emergency Department was reassuring. As discussed, please follow up with primary care and cardiology for an outpatient stress test. Seek emergency care if experiencing new or worsening symptoms.

## 2022-09-05 NOTE — ED Triage Notes (Signed)
SPANISH SPEAKING  Pt states last night she felt dizzy and started having left sided arm pain, head pain, and back pain  States BP was >200 systolic at home

## 2022-09-05 NOTE — ED Provider Notes (Signed)
[place] [facility] Provider Note   CSN: [id number] Arrival date & time: 09/05/22  1514     History  Chief Complaint  Patient presents with   Hypertension    Cynthia Mosley is a 57 y.o. female who presents to ED after becoming dizzy at work. Patient's boss took her BP which showed systolic >200. Patient also endorses chronic intermittent sharp chest pain and left ear pain that radiates down the left arm over the past 2 months. Patient denies fever, dyspnea, cough, nausea, vomiting, abdominal pain, calf tenderness/swelling. Denies recent travel/surgeries and denies history of blood clots.  Spanish translator used during interview.   Hypertension       Home Medications Prior to Admission medications   Not on File      Allergies    Patient has no known allergies.    Review of Systems   Review of Systems  Neurological:  Positive for dizziness.    Physical Exam Updated Vital Signs BP (!) 179/75   Pulse 78   Temp 98 F (36.7 C)   Resp 15   Ht 5\' 6"  (1.676 m)   Wt 109.8 kg   SpO2 99%   BMI 39.06 kg/m  Physical Exam Vitals and nursing note reviewed.  Constitutional:      General: She is not in acute distress.    Appearance: Normal appearance. She is not ill-appearing, toxic-appearing or diaphoretic.  HENT:     Head: Normocephalic and atraumatic.     Right Ear: Tympanic membrane, ear canal and external ear normal.     Left Ear: Tympanic membrane, ear canal and external ear normal.     Mouth/Throat:     Mouth: Mucous membranes are moist.     Pharynx: No oropharyngeal exudate or posterior oropharyngeal erythema.  Eyes:     General: No scleral icterus.       Right eye: No discharge.        Left eye: No discharge.     Conjunctiva/sclera: Conjunctivae normal.  Cardiovascular:     Rate and Rhythm: Normal rate and regular rhythm.     Pulses: Normal pulses.     Heart sounds: Normal heart sounds. No murmur heard. Pulmonary:      Effort: Pulmonary effort is normal. No respiratory distress.     Breath sounds: Normal breath sounds. No wheezing, rhonchi or rales.  Abdominal:     General: Abdomen is flat. Bowel sounds are normal.     Palpations: Abdomen is soft. There is no mass.     Tenderness: There is no abdominal tenderness.  Musculoskeletal:     Right lower leg: No edema.     Left lower leg: No edema.  Skin:    General: Skin is warm and dry.     Findings: No rash.  Neurological:     General: No focal deficit present.     Mental Status: She is alert. Mental status is at baseline.     Cranial Nerves: No cranial nerve deficit.     Sensory: No sensory deficit.  Psychiatric:        Mood and Affect: Mood normal.        Behavior: Behavior normal.     ED Results / Procedures / Treatments   Labs (all labs ordered are listed, but only abnormal results are displayed) Labs Reviewed  BASIC METABOLIC PANEL - Abnormal; Notable for the following components:      Result Value   Sodium 134 (*)  Potassium 3.3 (*)    Chloride 97 (*)    Glucose, Bld 183 (*)    All other components within normal limits  CBC  TROPONIN I (HIGH SENSITIVITY)  TROPONIN I (HIGH SENSITIVITY)    EKG EKG Interpretation  Date/Time:  Monday September 05 2022 15:28:37 EDT Ventricular Rate:  74 PR Interval:  145 QRS Duration: 95 QT Interval:  415 QTC Calculation: 461 R Axis:   -15 Text Interpretation: Sinus rhythm Borderline left axis deviation Low voltage, precordial leads Consider anterior infarct no prior ECG for comparison. t wave inversionin lead 3. No sTEMI Confirmed by Theda Belfast ([id number]) on 09/05/2022 3:33:20 PM  Radiology DG Chest 2 View  Result Date: 09/05/2022 CLINICAL DATA:  Chest pain EXAM: CHEST - 2 VIEW COMPARISON:  None Available. FINDINGS: Hazy opacity in the right lower lobe and possibly in the right mid lung field could represent atelectasis or infection. No pleural effusion. No pneumothorax. Normal cardiac and  mediastinal contours. No radiographically apparent displaced rib fractures. Visualized upper abdomen is unremarkable. Vertebral body heights are maintained. IMPRESSION: Hazy opacity in the right lower lobe and possibly in the right mid lung field could represent atelectasis or infection. Electronically Signed   By: Lorenza Cambridge M.D.   On: 09/05/2022 16:01    Procedures Procedures    Medications Ordered in ED Medications - No data to display  ED Course/ Medical Decision Making/ A&P                             Medical Decision Making Amount and/or Complexity of Data Reviewed Labs: ordered. Radiology: ordered.   This patient presents to the ED for concern of chest pain, this involves an extensive number of treatment options, and is a complaint that carries with it a high risk of complications and morbidity.  The differential diagnosis includes acute coronary syndrome, congestive heart failure, pulmonary embolism, tension pneumothorax, esophageal rupture, aortic dissection, cardiac tamponade, musculoskeletal   Co morbidities that complicate the patient evaluation  DM   Additional history obtained:  none   Lab Tests:  I Ordered, and personally interpreted labs.  The pertinent results include:  - Troponin: initial and repeat within normal limits - BMP: sodium and potassium mildly low (134/3.3) - CBC: no concern for anemia or leukocytosis   Imaging Studies ordered:  I ordered imaging studies including chest xray -Hazy opacity in the right lower lobe and possibly in the right mid lung field could represent atelectasis or infection. I independently visualized and interpreted imaging Findings shared with patient and recommended follow up with PCP I agree with the radiologist interpretation   Cardiac Monitoring: / EKG:  The patient was maintained on a cardiac monitor.  I personally viewed and interpreted the cardiac monitored which showed an underlying rhythm of: Normal sinus  rhythm without acute ST changes   Risk Stratification Score:  - HEART Score: 2 points (low risk) - Wells Score: 0   Problem List / ED Course / Critical interventions / Medication management  Patient presented for chest pain. Physical exam was unremarkable.  EKG/Troponin showed no concern for ACS. Patient without symptoms in ED. Chest xray showed opacity concerning for atelectasis vs infection - patient without respiratory symptoms and afebrile at this time so I am less concerned for PNA. I educated patient about xray findings and recommended that she follow up for repeat imaging with PCP. I shared with patient that I do not think she needs  inpatient treatment at this time. Patient agreed with me stating that she is ready to go home. Given patient's medical history and intermittent symptoms, I recommended her following up with cardiologist for outpatient stress-test. Patient agreed with plan stating that she would be very happy following through with cardiologist. Patient was given return precautions. Patient stable for discharge at this time.  Patient verbalized understanding of plan.  Ddx:  These are considered less likely due to history of present illness and physical exam findings.  Acute coronary syndrome: EKG and troponins within normal limits  Congestive heart failure: patient denies orthopnea, cough, and leg edema Pulmonary embolism: no recent surgeries, blood clot hx, hemoptysis, cancer hx, vitals stable Pneumothorax: lungs are clear to auscultation bilaterally Esophageal rupture: patient denies vomiting, heavy drinking, and hx of GERD Aortic dissection: vital signs are stable, no variation in pulse pressure Cardiac tamponade: absence of hypotension, JVD, and muffled heart sounds    Social Determinants of Health:  Spanish Speaking          Final Clinical Impression(s) / ED Diagnoses Final diagnoses:  Dizziness    Rx / DC Orders ED Discharge Orders     None          Oakley Rana 09/05/22 2118    Tegeler, Canary Brim, MD 09/05/22 2233

## 2024-06-17 ENCOUNTER — Ambulatory Visit: Payer: PRIVATE HEALTH INSURANCE | Admitting: Obstetrics
